# Patient Record
Sex: Female | Born: 1965 | Race: White | Hispanic: No | Marital: Married
Health system: Midwestern US, Community
[De-identification: ages and names within clinical notes are randomized; demographics above are authoritative.]

## PROBLEM LIST (undated history)

## (undated) DIAGNOSIS — C801 Malignant (primary) neoplasm, unspecified: Secondary | ICD-10-CM

## (undated) DIAGNOSIS — E213 Hyperparathyroidism, unspecified: Secondary | ICD-10-CM

## (undated) DIAGNOSIS — R51 Headache: Secondary | ICD-10-CM

## (undated) DIAGNOSIS — I10 Essential (primary) hypertension: Secondary | ICD-10-CM

## (undated) DIAGNOSIS — R519 Headache, unspecified: Secondary | ICD-10-CM

## (undated) DIAGNOSIS — E042 Nontoxic multinodular goiter: Secondary | ICD-10-CM

## (undated) DIAGNOSIS — T4145XA Adverse effect of unspecified anesthetic, initial encounter: Secondary | ICD-10-CM

## (undated) DIAGNOSIS — E0590 Thyrotoxicosis, unspecified without thyrotoxic crisis or storm: Secondary | ICD-10-CM

## (undated) HISTORY — DX: Hyperparathyroidism, unspecified: E21.3

## (undated) HISTORY — PX: ENDOMETRIAL ABLATION: SHX621

---

## 1985-11-21 DIAGNOSIS — T8859XA Other complications of anesthesia, initial encounter: Secondary | ICD-10-CM

## 1985-11-21 HISTORY — DX: Other complications of anesthesia, initial encounter: T88.59XA

## 1985-11-21 HISTORY — PX: CERVIX SURGERY: SHX593

## 1995-11-22 HISTORY — PX: TUBAL LIGATION: SHX77

## 1999-07-08 ENCOUNTER — Ambulatory Visit (HOSPITAL_COMMUNITY): Admission: RE | Admit: 1999-07-08 | Discharge: 1999-07-08 | Payer: Self-pay | Admitting: Family Medicine

## 1999-07-08 ENCOUNTER — Encounter: Payer: Self-pay | Admitting: Family Medicine

## 2000-12-14 ENCOUNTER — Ambulatory Visit (HOSPITAL_COMMUNITY): Admission: RE | Admit: 2000-12-14 | Discharge: 2000-12-14 | Payer: Self-pay | Admitting: *Deleted

## 2000-12-29 ENCOUNTER — Ambulatory Visit (HOSPITAL_COMMUNITY): Admission: RE | Admit: 2000-12-29 | Discharge: 2000-12-29 | Payer: Self-pay | Admitting: *Deleted

## 2001-10-02 ENCOUNTER — Ambulatory Visit (HOSPITAL_COMMUNITY): Admission: RE | Admit: 2001-10-02 | Discharge: 2001-10-02 | Payer: Self-pay | Admitting: Obstetrics and Gynecology

## 2001-10-02 ENCOUNTER — Encounter (INDEPENDENT_AMBULATORY_CARE_PROVIDER_SITE_OTHER): Payer: Self-pay

## 2002-10-03 ENCOUNTER — Other Ambulatory Visit: Admission: RE | Admit: 2002-10-03 | Discharge: 2002-10-03 | Payer: Self-pay | Admitting: Obstetrics and Gynecology

## 2003-01-13 ENCOUNTER — Encounter: Payer: Self-pay | Admitting: Family Medicine

## 2003-01-13 ENCOUNTER — Encounter: Admission: RE | Admit: 2003-01-13 | Discharge: 2003-01-13 | Payer: Self-pay | Admitting: Family Medicine

## 2003-10-15 ENCOUNTER — Other Ambulatory Visit: Admission: RE | Admit: 2003-10-15 | Discharge: 2003-10-15 | Payer: Self-pay | Admitting: Obstetrics and Gynecology

## 2003-11-25 ENCOUNTER — Encounter: Admission: RE | Admit: 2003-11-25 | Discharge: 2003-11-25 | Payer: Self-pay | Admitting: Family Medicine

## 2004-10-18 ENCOUNTER — Other Ambulatory Visit: Admission: RE | Admit: 2004-10-18 | Discharge: 2004-10-18 | Payer: Self-pay | Admitting: Obstetrics and Gynecology

## 2005-07-04 ENCOUNTER — Other Ambulatory Visit: Admission: RE | Admit: 2005-07-04 | Discharge: 2005-07-04 | Payer: Self-pay | Admitting: Obstetrics and Gynecology

## 2005-07-14 ENCOUNTER — Ambulatory Visit (HOSPITAL_BASED_OUTPATIENT_CLINIC_OR_DEPARTMENT_OTHER): Admission: RE | Admit: 2005-07-14 | Discharge: 2005-07-14 | Payer: Self-pay | Admitting: Obstetrics and Gynecology

## 2005-07-14 ENCOUNTER — Ambulatory Visit (HOSPITAL_COMMUNITY): Admission: RE | Admit: 2005-07-14 | Discharge: 2005-07-14 | Payer: Self-pay | Admitting: Obstetrics and Gynecology

## 2006-10-18 ENCOUNTER — Encounter: Admission: RE | Admit: 2006-10-18 | Discharge: 2006-10-18 | Payer: Self-pay | Admitting: Obstetrics and Gynecology

## 2007-04-24 ENCOUNTER — Encounter: Admission: RE | Admit: 2007-04-24 | Discharge: 2007-04-24 | Payer: Self-pay | Admitting: Obstetrics and Gynecology

## 2009-11-21 HISTORY — PX: VAGINAL HYSTERECTOMY: SUR661

## 2010-07-21 ENCOUNTER — Encounter (INDEPENDENT_AMBULATORY_CARE_PROVIDER_SITE_OTHER): Payer: Self-pay | Admitting: Obstetrics and Gynecology

## 2010-07-21 ENCOUNTER — Inpatient Hospital Stay (HOSPITAL_COMMUNITY)
Admission: RE | Admit: 2010-07-21 | Discharge: 2010-07-22 | Payer: Self-pay | Source: Home / Self Care | Admitting: Obstetrics and Gynecology

## 2010-11-21 LAB — HM DEXA SCAN: HM Dexa Scan: NORMAL

## 2010-12-11 ENCOUNTER — Encounter: Payer: Self-pay | Admitting: Obstetrics and Gynecology

## 2011-02-03 LAB — BASIC METABOLIC PANEL
Calcium: 11 mg/dL — ABNORMAL HIGH (ref 8.4–10.5)
Chloride: 105 mEq/L (ref 96–112)
GFR calc Af Amer: >60 mL/min (ref >60)
Glucose, Bld: 88 mg/dL (ref 70–99)
Sodium: 138 mEq/L (ref 135–145)

## 2011-02-03 LAB — SURGICAL PCR SCREEN: Staphylococcus aureus: INVALID — AB

## 2011-02-03 LAB — MRSA CULTURE

## 2011-02-03 LAB — CBC
HCT: 37.4 % (ref 36.0–46.0)
HCT: 43 % (ref 36.0–46.0)
MCH: 32.7 pg (ref 26.0–34.0)
MCHC: 34.1 g/dL (ref 30.0–36.0)
MCHC: 34.4 g/dL (ref 30.0–36.0)
MCV: 95 fL (ref 78.0–100.0)
RBC: 3.87 MIL/uL (ref 3.87–5.11)
RDW: 13.3 % (ref 11.5–15.5)
RDW: 12.8 % (ref 11.5–15.5)

## 2011-04-08 NOTE — Op Note (Signed)
[facility]  Patient:    Sherry Costa, Sherry Costa Visit Number: [id number] MRN: [id number]          Service Type: Attending:  Janeece Riggers. Dareen Piano, M.D. Dictated by:   Janeece Riggers Dareen Piano, M.D. Proc. Date: 10/02/01                             Operative Report  PREOPERATIVE DIAGNOSIS:       Menorrhagia.  POSTOPERATIVE DIAGNOSIS:      Menorrhagia.  PROCEDURES:                   1. Dilation and curettage.                               2. Cryoablation of endometrium.  SURGEON:                      Mark E. Dareen Piano, M.D.  ANESTHESIA:                   General.  ANTIBIOTICS:                  Ancef 1 g.  DRAINS:                       Red rubber catheter to the bladder.  SPECIMENS:                    Endometrial and endocervical curettings sent to pathology.  COMPLICATIONS:                None.  ESTIMATED BLOOD LOSS:         Minimal.  DESCRIPTION OF PROCEDURE:     The patient was taken to the operating room, where she was placed in the dorsal supine position.  A general anesthetic was administered without complications.  She was then prepped with Hibiclens and her bladder was drained.  She was draped in the usual fashion for D&C.  A sterile speculum was placed in the vagina.  A single-tooth tenaculum was applied to the anterior cervical lip.  The uterus was then sounded to 9 cm. At this point, the endocervical canal was dilated to #27 [place].  Endocervical curettings were then obtained, followed by an endometrial curettage. Specimens were sent to pathology.  At the conclusion of this procedure, the cryoablation machine was prepared.  After the warm up, the cry tip was placed in the right cornu and freeze was performed for six minutes.  Once the thaw was completed, the probe was then placed on the opposite side and, again, freeze performed for six minutes.  This concluded the procedure.  The patient was then taken to the recovery room in stable condition.  She  will be discharged to home with Darvocet to take p.r.n.  She was instructed to follow up in the office in four weeks.  She will be given Toradol in the recovery room. Dictated by:   Janeece Riggers Dareen Piano, M.D. Attending:  Janeece Riggers Dareen Piano, M.D. DD:  10/02/01 TD:  10/02/01 Job: [id number]

## 2011-04-08 NOTE — Op Note (Signed)
NAME:  Sherry Costa, Sherry Costa NO.:  [id number]   MEDICAL RECORD NO.:  [id number]          PATIENT TYPE:  AMB   LOCATION:  [facility]                         FACILITY:  [facility]   PHYSICIAN:  Malva Limes, M.D.    DATE OF BIRTH:  [birth year]   DATE OF PROCEDURE:  07/14/2005  DATE OF DISCHARGE:                                 OPERATIVE REPORT   PREOPERATIVE DIAGNOSES:  1.  Menorrhagia.  2.  Failed cryoablation.   POSTOPERATIVE DIAGNOSES:  1.  Menorrhagia.  2.  Failed cryoablation.   PROCEDURE:  1.  Hysteroscopy.  2.  Endometrial roller ball ablation.   SURGEON:  Malva Limes, M.D.   ANESTHESIA:  General.   ANTIBIOTICS:  Ancef 1 gram.   CONSULTATIONS:  None.   SPECIMENS:  None.   DRAINS:  Red rubber catheter in the bladder.   ESTIMATED BLOOD LOSS:  Minimal.   DESCRIPTION OF PROCEDURE:  The patient was taken to the operating room where  she was placed in the dorsal supine position. A general anesthetic was  administered without complications. She was then placed in the dorsal  lithotomy position, she was prepped with Betadine and her bladder drained  with a red rubber catheter. She was then draped in the usual fashion. A  speculum was placed in the vagina, a single tooth tenaculum was applied to  the anterior cervical lip. The cervix was dilated to a 70 [place]. The uterus  was sounded to 8 cm. The hysteroscope was advanced into the cervical canal.  On entering the uterine cavity, both ostia were visualized. There was no  evidence of any polyps or fibroids. At this point, the roller ball was set  up and the endometrium treated with coagulation beginning posteriorly and  working in a counter clockwise fashion. All areas of endometrium and  endocervix were  then treated. Following this, the loop electrode was used to shave off more  tissue and followup roller ball done. The patient tolerated the procedure  well. She was taken to the recovery room in stable condition.  Instrument and  lap counts were correct x1. She was discharged to home. She will be  instructed to followup in the office in four weeks.           ______________________________  Malva Limes, M.D.     MA/MEDQ  D:  07/14/2005  T:  07/14/2005  Job:  [id number]

## 2012-06-21 LAB — HM PAP SMEAR: HM Pap smear: NORMAL

## 2012-08-06 ENCOUNTER — Ambulatory Visit (INDEPENDENT_AMBULATORY_CARE_PROVIDER_SITE_OTHER): Payer: BC Managed Care – PPO | Admitting: Family Medicine

## 2012-08-06 VITALS — BP 160/90 | HR 99 | Temp 98.0°F | Resp 16 | Ht 63.5 in | Wt 220.0 lb

## 2012-08-06 DIAGNOSIS — R51 Headache: Secondary | ICD-10-CM

## 2012-08-06 DIAGNOSIS — R6889 Other general symptoms and signs: Secondary | ICD-10-CM

## 2012-08-06 DIAGNOSIS — R5383 Other fatigue: Secondary | ICD-10-CM

## 2012-08-06 DIAGNOSIS — R0989 Other specified symptoms and signs involving the circulatory and respiratory systems: Secondary | ICD-10-CM

## 2012-08-06 DIAGNOSIS — R110 Nausea: Secondary | ICD-10-CM

## 2012-08-06 DIAGNOSIS — H538 Other visual disturbances: Secondary | ICD-10-CM

## 2012-08-06 DIAGNOSIS — I10 Essential (primary) hypertension: Secondary | ICD-10-CM

## 2012-08-06 DIAGNOSIS — Z8639 Personal history of other endocrine, nutritional and metabolic disease: Secondary | ICD-10-CM

## 2012-08-06 DIAGNOSIS — R5381 Other malaise: Secondary | ICD-10-CM

## 2012-08-06 LAB — POCT CBC
Hemoglobin: 14.8 g/dL (ref 12.2–16.2)
MCH, POC: 30.2 pg (ref 27–31.2)
MID (cbc): 0.3 (ref 0–0.9)
MPV: 9.1 fL (ref 0–99.8)
POC Granulocyte: 4.4 (ref 2–6.9)
POC LYMPH PERCENT: 19.7 %L (ref 10–50)
POC MID %: 4.9 %M (ref 0–12)
Platelet Count, POC: 314 10*3/uL (ref 142–424)
RDW, POC: 13.4 %
WBC: 5.9 10*3/uL (ref 4.6–10.2)

## 2012-08-06 LAB — GLUCOSE, POCT (MANUAL RESULT ENTRY): POC Glucose: 93 mg/dl (ref 70–99)

## 2012-08-06 MED ORDER — CLONIDINE HCL 0.1 MG PO TABS
0.1000 mg | ORAL_TABLET | Freq: Once | ORAL | Status: AC
  Administered 2012-08-06: 0.1 mg via ORAL

## 2012-08-06 MED ORDER — LISINOPRIL 20 MG PO TABS: 20.0000 mg | ORAL_TABLET | Freq: Every day | ORAL | Status: DC

## 2012-08-06 MED ORDER — ONDANSETRON 4 MG PO TBDP: 4.0000 mg | ORAL_TABLET | Freq: Three times a day (TID) | ORAL | Status: DC | PRN

## 2012-08-06 NOTE — Patient Instructions (Signed)
Start lisinopril as discussed.  zofran as needed up to every 8 hours for nausea. If any recurrence of blurry vision or choking sensation, worsening of headaches, or other worsening symptoms - go to the emergency room or call 911. Recheck in the next 2 -3 days for your blood pressure. Return to the clinic or go to the nearest emergency room if any of your symptoms worsen or new symptoms occur. Your should receive a call or letter about your lab results within the next week to 10 days.

## 2012-08-06 NOTE — Progress Notes (Signed)
Subjective:    Patient ID: Sherry Costa, female    DOB: Mar 09, 1966, 46 y.o.   MRN: [id number]  HPI TEQUITA FALKENHAGEN is a 46 y.o. female  C/o headache - starting this morning.  Woke up due to headache.  Works at night - until 12:30.  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  Hx of headache in past - feels different today - feels like going into neck. Nausea, but no vomiting. Blurry vision - both eyes.  Noticed when driving or reading today.  No chest pain.  Feels like about to choke.  hx of goiter.  Feels like going to choke - trouble swallowing - especially when lying down.  Notices this today only.    Hx of HTN - Off BP meds for months - wanted to find new primary care physician.  No chest pain, no shortness of breath.  Prior on Lisinopril 20mg  QD.   Hx of goiter, with radiation to thyroid - underactive thyroid.  Endocrinologist in [place] - last seen in beginning of year.  Was not on meds.   Stressors recently - separated, and not getting along with college-aged daughter.   Hx of migraines, but has not had one in awhile - does not remember if had nausea or not.   Hx of allergies and sinus pressure with headache.    Review of Systems  Constitutional: Negative for fever and chills.  Respiratory: Positive for choking.   Cardiovascular: Negative for chest pain.  Neurological: Positive for headaches. Negative for dizziness, syncope, speech difficulty, weakness and light-headedness.  Psychiatric/Behavioral: Negative for suicidal ideas and dysphoric mood. The patient is not nervous/anxious.    As above -     Objective:   Physical Exam  Constitutional: She is oriented to person, place, and time. She appears well-developed and well-nourished.       Appears uncomfortable, flat affect - but no distress.   HENT:  Head: Normocephalic and atraumatic.  Eyes: EOM are normal. Pupils are equal, round, and reactive to light.  Cardiovascular: Normal rate, regular rhythm, normal heart sounds and intact  distal pulses.  Exam reveals no gallop and no friction rub.   No murmur heard. Pulmonary/Chest: Effort normal and breath sounds normal.  Abdominal: Soft.  Neurological: She is alert and oriented to person, place, and time. She has normal strength. She displays no tremor. No sensory deficit. She displays a negative Romberg sign. Coordination and gait normal.       No pronator drift. No focal face or extremity weakness, normal speech.   Skin: Skin is warm and dry. She is not diaphoretic.  Psychiatric: She has a normal mood and affect. Her behavior is normal. She expresses no suicidal ideation. She expresses no suicidal plans.   Clonidine 0.1mg  given po at 1640.  1730: feeling better.  Vision is better.  Headache is a little less.  choking sensation better.  Results for orders placed in visit on 08/06/12  GLUCOSE, POCT (MANUAL RESULT ENTRY)      Component Value Range   POC Glucose 93  70 - 99 mg/dl  POCT CBC      Component Value Range   WBC 5.9  4.6 - 10.2 K/uL   Lymph, poc 1.2  0.6 - 3.4   POC LYMPH PERCENT 19.7  10 - 50 %L   MID (cbc) 0.3  0 - 0.9   POC MID % 4.9  0 - 12 %M   POC Granulocyte 4.4  2 - 6.9  Granulocyte percent 75.4  37 - 80 %G   RBC 4.90  4.04 - 5.48 M/uL   Hemoglobin 14.8  12.2 - 16.2 g/dL   HCT, POC 13.0  86.5 - 47.9 %   MCV 96.6  80 - 97 fL   MCH, POC 30.2  27 - 31.2 pg   MCHC 31.3 (*) 31.8 - 35.4 g/dL   RDW, POC 78.4     Platelet Count, POC 314  142 - 424 K/uL   MPV 9.1  0 - 99.8 fL           Assessment & Plan:  KERSTIN CRUSOE is a 46 y.o. female 1. HTN (hypertension)  Basic metabolic panel, lisinopril (PRINIVIL,ZESTRIL) 20 MG tablet  2. Headache  cloNIDine (CATAPRES) tablet 0.1 mg, POCT glucose (manual entry), Basic metabolic panel, TSH, POCT CBC  3. Choking sensation  TSH  4. Fatigue  POCT glucose (manual entry), Basic metabolic panel, TSH, POCT CBC  5. Blurry vision  POCT glucose (manual entry), POCT CBC  6. Nausea  ondansetron (ZOFRAN ODT) 4 MG  disintegrating tablet  7. History of thyroid disease  Ambulatory referral to Endocrinology     Headache - likely multifactorial, with underlying HTN - uncontrolled off meds, social stressors, migraines by hx, and thyroid disease.  Improved symptoms of vision, headache and resolution of choking sensation after clonidine. Will restart lisinopril at prior dose, and recheck in 2-3 days. Rtc/er precautions given. understanding expressed of plan.  Tylenol and zofran for headache and nausea tonight as stronger meds may confuse possible signs of worsening headache or hypertensive urgency.   Hx of thyroid disease - requests new endocrinologist - referral placed.   Patient Instructions  Start lisinopril as discussed.  zofran as needed up to every 8 hours for nausea. If any recurrence of blurry vision or choking sensation, worsening of headaches, or other worsening symptoms - go to the emergency room or call 911. Recheck in the next 2 -3 days for your blood pressure. Return to the clinic or go to the nearest emergency room if any of your symptoms worsen or new symptoms occur. Your should receive a call or letter about your lab results within the next week to 10 days.

## 2012-08-07 LAB — BASIC METABOLIC PANEL
BUN: 12 mg/dL (ref 6–23)
Calcium: 10.6 mg/dL — ABNORMAL HIGH (ref 8.4–10.5)
Chloride: 101 mEq/L (ref 96–112)
Glucose, Bld: 108 mg/dL — ABNORMAL HIGH (ref 70–99)
Sodium: 139 mEq/L (ref 135–145)

## 2012-08-21 DIAGNOSIS — E213 Hyperparathyroidism, unspecified: Secondary | ICD-10-CM

## 2012-08-21 HISTORY — DX: Hyperparathyroidism, unspecified: E21.3

## 2012-09-05 ENCOUNTER — Other Ambulatory Visit: Payer: Self-pay | Admitting: Endocrinology

## 2012-09-05 DIAGNOSIS — E049 Nontoxic goiter, unspecified: Secondary | ICD-10-CM

## 2012-09-12 ENCOUNTER — Ambulatory Visit
Admission: RE | Admit: 2012-09-12 | Discharge: 2012-09-12 | Disposition: A | Discharge status: Home / Self Care | Payer: BC Managed Care – PPO | Source: Ambulatory Visit | Attending: Endocrinology | Admitting: Endocrinology

## 2012-09-12 DIAGNOSIS — E049 Nontoxic goiter, unspecified: Secondary | ICD-10-CM

## 2012-09-13 ENCOUNTER — Other Ambulatory Visit (HOSPITAL_COMMUNITY): Payer: Self-pay | Admitting: Endocrinology

## 2012-09-13 DIAGNOSIS — E213 Hyperparathyroidism, unspecified: Secondary | ICD-10-CM

## 2012-09-24 ENCOUNTER — Encounter (HOSPITAL_COMMUNITY)
Admission: RE | Admit: 2012-09-24 | Discharge: 2012-09-24 | Disposition: A | Discharge status: Home / Self Care | Payer: BC Managed Care – PPO | Source: Ambulatory Visit | Attending: Endocrinology | Admitting: Endocrinology

## 2012-09-24 ENCOUNTER — Ambulatory Visit (HOSPITAL_COMMUNITY)
Admission: RE | Admit: 2012-09-24 | Discharge: 2012-09-24 | Disposition: A | Discharge status: Home / Self Care | Payer: BC Managed Care – PPO | Source: Ambulatory Visit | Attending: Endocrinology | Admitting: Endocrinology

## 2012-09-24 DIAGNOSIS — E213 Hyperparathyroidism, unspecified: Secondary | ICD-10-CM | POA: Insufficient documentation

## 2012-09-24 MED ORDER — TECHNETIUM TC 99M SESTAMIBI - CARDIOLITE
24.1000 | Freq: Once | INTRAVENOUS | Status: AC | PRN
  Administered 2012-09-24: 24.1 via INTRAVENOUS

## 2012-09-26 ENCOUNTER — Other Ambulatory Visit: Payer: Self-pay | Admitting: Family Medicine

## 2012-10-16 ENCOUNTER — Other Ambulatory Visit (HOSPITAL_COMMUNITY): Payer: Self-pay | Admitting: Otolaryngology

## 2012-10-16 DIAGNOSIS — E213 Hyperparathyroidism, unspecified: Secondary | ICD-10-CM

## 2012-10-22 ENCOUNTER — Other Ambulatory Visit: Payer: Self-pay | Admitting: Otolaryngology

## 2012-10-22 NOTE — H&P (Signed)
Assessment   Hypercalcemia (275.42) (E83.52).  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  Snoring (786.09) (R06.83).  Obesity (278.00) (E66.9). Orders  Sleep Study; Sleep Study (PSG); Yes; Yes; Requested for: 10 Sep 2012. Discussed  History of hyperthyroidism, treated with radioactive iodine about 10 years ago. She seems to be doing well from this perspective and her most recent TSH was within normal limits.   Hypercalcemia, most recent test revealed 10.6. We will see if anything shows up on the ultrasound scheduled later this week and consider sestamibi scan following that. If she needs thyroid surgery, that is an excellent time to also perform her thyroidectomy.   Thyroid nodule. Dominant nodule palpable in the isthmus. She has ultrasound scheduled for Wednesday. I will try to obtain the most recent one from last year from [facility].   Some of her choking and neck tightness sensation may be reflux-related. Recommend she cut back on caffeine and peppermint.   Snoring, possible sleep apnea. Recommend a sleep study. Reason For Visit  Sherry Costa is here today at the kind request of Balan, Bindubal for consultation and opinion. Goiter on right side. HPI  She was diagnosed with hyperthyroidism about 10 years ago and treated with radioactive iodine ablation. She has been followed by several different physicians for multinodular goiter. She has an ultrasound scheduled for Wednesday. The most recent one prior to that was [place] [facility] a year ago. I do not have the results of that. She does complain of a tightness in her neck and a choking sensation especially if she turns her head to the left. She denies any active reflux symptoms although she has had that in the past. She drinks it can or bottle of Pepsi each day but she tends to like mint flavored candy. She also has a severe problem with snoring and wakes herself up snoring sometimes. She doesn't  get a very good sleep quality and gets fatigued and very tired during the day. She does work irregular hours and sometimes works at night. She also suffers with migraine. Allergies  No Known Drug Allergies. Current Meds  Lisinopril 20 MG Oral Tablet;TAKE 1 TABLET DAILY.; RPT. Active Problems  Essential hypertension  (401.9) (I10) Goiter diffuse, nontoxic  (240.9) (E04.0) Hypercalcemia  (275.42) (E83.52) Nonspecific abnormal results of thyroid function study  (794.5) (R94.6) Nontoxic multinodular goiter  (241.1) (E04.2) Thyroid disorder  (246.9) (E07.9) Vitamin d deficiency  (268.9) (E55.9). PMH  History of diabetes mellitus (V12.29) (Z86.39) History of hyperlipidemia (V12.29) (Z86.39). Family Hx  Family history of deafness or hearing loss: Mother,Father (V19.2) (Z82.2). Personal Hx  Current non-smoker (V49.89) (Z78.9) Never a smoker Occupation:; [employer], Clinical biochemist. ROS  Systemic: Not feeling tired (fatigue).  No fever, no night sweats, and no recent weight loss. Head: No headache. Eyes: No eye symptoms. Otolaryngeal: No hearing loss, no earache, no tinnitus, and no purulent nasal discharge.  No nasal passage blockage (stuffiness), no snoring, no sneezing, no hoarseness, and no sore throat. Cardiovascular: No chest pain or discomfort  and no palpitations. Pulmonary: No dyspnea, no cough, and no wheezing. Gastrointestinal: No dysphagia  and no heartburn.  No nausea, no abdominal pain, and no melena.  No diarrhea. Genitourinary: No dysuria. Endocrine: No muscle weakness. Musculoskeletal: No calf muscle cramps, no arthralgias, and no soft tissue swelling. Neurological: No dizziness, no fainting, no tingling, and no numbness. Psychological: No anxiety  and no depression. Skin: No rash. 12 system ROS was obtained and reviewed on  the Health Maintenance form dated today.  Positive responses are shown above.  If the symptom is not checked, the patient has denied it. Vital Signs     Recorded by Rogers,Lisa on 10 Sep 2012 01:20 PM BP:174/114,  Height: 62.5 in, Weight: 210 lb, BMI: 37.8 kg/m2,  BMI Calculated: 37.80 ,  BSA Calculated: 1.96. Physical Exam  APPEARANCE: Well developed, overweight lady, in no acute distress.  Normal affect, in a pleasant mood.  Oriented to time, place and person. COMMUNICATION: Normal voice   HEAD & FACE:  No scars, lesions or masses of head and face.  Sinuses nontender to palpation.  Salivary glands without mass or tenderness.  Facial strength symmetric.  No facial lesion, scars, or mass. EYES: EOMI with normal primary gaze alignment. Visual acuity grossly intact.  PERRLA EXTERNAL EAR & NOSE: No scars, lesions or masses  EAC & TYMPANIC MEMBRANE:  EAC shows no obstructing lesions or debris and tympanic membranes are normal bilaterally with good movement to insufflation. GROSS HEARING: Normal  TMJ:  Nontender  INTRANASAL EXAM: No polyps or purulence.  NASOPHARYNX: Normal, without lesions. LIPS, TEETH & GUMS: No lip lesions, normal dentition and normal gums. ORAL CAVITY/OROPHARYNX:  Oral mucosa moist without lesion or asymmetry of the palate, tongue, tonsil or posterior pharynx. There is no evidence of tonsillar enlargement. LARYNX (mirror exam):  No lesions of the epiglottis, false cord or TVC's and cords move well to phonation. HYPOPHARYNX (mirror exam): No lesions, asymmetry or pooling of secretions. NECK:  Supple without adenopathy or mass. THYROID: 2 cm mass palpable anteriorly, unable to palpate any  other discrete masses.  NEUROLOGIC:  No gross CN deficits. No nystagmus noted.   LYMPHATIC:  No enlarged nodes palpable. Signature  Electronically signed by : Serena Colonel  M.D.; 09/10/2012 3:32 PM EST.

## 2012-10-31 ENCOUNTER — Encounter (HOSPITAL_COMMUNITY): Payer: Self-pay | Admitting: Pharmacy Technician

## 2012-11-01 ENCOUNTER — Encounter (HOSPITAL_COMMUNITY)
Admission: RE | Admit: 2012-11-01 | Discharge: 2012-11-01 | Disposition: A | Discharge status: Home / Self Care | Payer: BC Managed Care – PPO | Source: Ambulatory Visit | Attending: Anesthesiology | Admitting: Anesthesiology

## 2012-11-01 ENCOUNTER — Encounter (HOSPITAL_COMMUNITY)
Admission: RE | Admit: 2012-11-01 | Discharge: 2012-11-01 | Disposition: A | Discharge status: Home / Self Care | Payer: BC Managed Care – PPO | Source: Ambulatory Visit | Attending: Otolaryngology | Admitting: Otolaryngology

## 2012-11-01 ENCOUNTER — Encounter (HOSPITAL_COMMUNITY): Payer: Self-pay

## 2012-11-01 HISTORY — DX: Nontoxic multinodular goiter: E04.2

## 2012-11-01 HISTORY — DX: Essential (primary) hypertension: I10

## 2012-11-01 HISTORY — DX: Hypercalcemia: E83.52

## 2012-11-01 HISTORY — DX: Thyrotoxicosis, unspecified without thyrotoxic crisis or storm: E05.90

## 2012-11-01 HISTORY — DX: Adverse effect of unspecified anesthetic, initial encounter: T41.45XA

## 2012-11-01 LAB — BASIC METABOLIC PANEL
BUN: 9 mg/dL (ref 6–23)
Chloride: 99 mEq/L (ref 96–112)
Creatinine, Ser: 0.84 mg/dL (ref 0.50–1.10)
GFR calc Af Amer: >90 mL/min (ref >90)
GFR calc non Af Amer: 82 mL/min — ABNORMAL LOW (ref >90)
Potassium: 4 mEq/L (ref 3.5–5.1)

## 2012-11-01 LAB — CBC
MCHC: 35 g/dL (ref 30.0–36.0)
MCV: 90.1 fL (ref 78.0–100.0)
Platelets: 295 10*3/uL (ref 150–400)
RDW: 12.6 % (ref 11.5–15.5)
WBC: 4.9 10*3/uL (ref 4.0–10.5)

## 2012-11-01 LAB — SURGICAL PCR SCREEN: Staphylococcus aureus: NEGATIVE

## 2012-11-01 NOTE — Pre-Procedure Instructions (Signed)
20 Sherry Costa  11/01/2012   Your procedure is scheduled on:  December 20  Report to [facility] at 09:30 AM.  Call this number if you have problems the morning of surgery: [phone]   Remember:   Do not eat or drink:After Midnight.  Take these medicines the morning of surgery with A SIP OF WATER: Eye drops   STOP Vitamin D after December 13  Do not wear jewelry, make-up or nail polish.  Do not wear lotions, powders, or perfumes. You may wear deodorant.  Do not shave 48 hours prior to surgery. Men may shave face and neck.  Do not bring valuables to the hospital.  Contacts, dentures or bridgework may not be worn into surgery.  Leave suitcase in the car. After surgery it may be brought to your room.  For patients admitted to the hospital, checkout time is 11:00 AM the day of discharge.   Special Instructions: Shower using CHG 2 nights before surgery and the night before surgery.  If you shower the day of surgery use CHG.  Use special wash - you have one bottle of CHG for all showers.  You should use approximately 1/3 of the bottle for each shower.   Please read over the following fact sheets that you were given: Pain Booklet, Coughing and Deep Breathing and Surgical Site Infection Prevention

## 2012-11-08 MED ORDER — CEFAZOLIN SODIUM-DEXTROSE 2-3 GM-% IV SOLR
2.0000 g | INTRAVENOUS | Status: AC
  Administered 2012-11-09: 2 g via INTRAVENOUS
  Filled 2012-11-08: qty 50

## 2012-11-09 ENCOUNTER — Ambulatory Visit (HOSPITAL_COMMUNITY): Payer: BC Managed Care – PPO | Admitting: Certified Registered Nurse Anesthetist

## 2012-11-09 ENCOUNTER — Encounter (HOSPITAL_COMMUNITY)
Admission: RE | Disposition: A | Discharge status: Home / Self Care | Payer: Self-pay | Source: Ambulatory Visit | Attending: Otolaryngology

## 2012-11-09 ENCOUNTER — Encounter (HOSPITAL_COMMUNITY): Payer: Self-pay | Admitting: Certified Registered Nurse Anesthetist

## 2012-11-09 ENCOUNTER — Observation Stay (HOSPITAL_COMMUNITY)
Admission: RE | Admit: 2012-11-09 | Discharge: 2012-11-10 | Disposition: A | Discharge status: Home / Self Care | Payer: BC Managed Care – PPO | Source: Ambulatory Visit | Attending: Otolaryngology | Admitting: Otolaryngology

## 2012-11-09 ENCOUNTER — Encounter (HOSPITAL_COMMUNITY)
Admission: RE | Admit: 2012-11-09 | Discharge: 2012-11-09 | Disposition: A | Discharge status: Home / Self Care | Payer: BC Managed Care – PPO | Source: Ambulatory Visit | Attending: Otolaryngology | Admitting: Otolaryngology

## 2012-11-09 DIAGNOSIS — E079 Disorder of thyroid, unspecified: Secondary | ICD-10-CM

## 2012-11-09 DIAGNOSIS — E213 Hyperparathyroidism, unspecified: Secondary | ICD-10-CM | POA: Insufficient documentation

## 2012-11-09 DIAGNOSIS — C73 Malignant neoplasm of thyroid gland: Principal | ICD-10-CM | POA: Insufficient documentation

## 2012-11-09 DIAGNOSIS — E0520 Thyrotoxicosis with toxic multinodular goiter without thyrotoxic crisis or storm: Secondary | ICD-10-CM | POA: Insufficient documentation

## 2012-11-09 DIAGNOSIS — I10 Essential (primary) hypertension: Secondary | ICD-10-CM | POA: Insufficient documentation

## 2012-11-09 HISTORY — PX: TOTAL THYROIDECTOMY: SHX2547

## 2012-11-09 HISTORY — PX: THYROIDECTOMY: SHX17

## 2012-11-09 HISTORY — DX: Headache: R51

## 2012-11-09 HISTORY — DX: Headache, unspecified: R51.9

## 2012-11-09 HISTORY — PX: PARATHYROIDECTOMY: SHX19

## 2012-11-09 SURGERY — THYROIDECTOMY
Anesthesia: General | Site: Neck | Laterality: Right | Wound class: Clean

## 2012-11-09 MED ORDER — SUCCINYLCHOLINE CHLORIDE 20 MG/ML IJ SOLN
INTRAMUSCULAR | Status: DC | PRN
  Administered 2012-11-09: 120 mg via INTRAVENOUS

## 2012-11-09 MED ORDER — PROMETHAZINE HCL 25 MG RE SUPP: 25.0000 mg | Freq: Four times a day (QID) | RECTAL | Status: DC | PRN

## 2012-11-09 MED ORDER — ONDANSETRON HCL 4 MG/2ML IJ SOLN: 4.0000 mg | Freq: Once | INTRAMUSCULAR | Status: DC | PRN

## 2012-11-09 MED ORDER — IBUPROFEN 100 MG/5ML PO SUSP
400.0000 mg | Freq: Four times a day (QID) | ORAL | Status: DC | PRN
  Filled 2012-11-09: qty 20

## 2012-11-09 MED ORDER — PROMETHAZINE HCL 25 MG PO TABS: 25.0000 mg | ORAL_TABLET | Freq: Four times a day (QID) | ORAL | Status: DC | PRN

## 2012-11-09 MED ORDER — GLYCOPYRROLATE 0.2 MG/ML IJ SOLN
INTRAMUSCULAR | Status: DC | PRN
  Administered 2012-11-09: 0.6 mg via INTRAVENOUS

## 2012-11-09 MED ORDER — PHENYLEPHRINE HCL 10 MG/ML IJ SOLN
INTRAMUSCULAR | Status: DC | PRN
  Administered 2012-11-09: 40 ug via INTRAVENOUS
  Administered 2012-11-09 (×2): 80 ug via INTRAVENOUS

## 2012-11-09 MED ORDER — ONDANSETRON HCL 4 MG/2ML IJ SOLN
INTRAMUSCULAR | Status: DC | PRN
  Administered 2012-11-09: 4 mg via INTRAVENOUS

## 2012-11-09 MED ORDER — MIDAZOLAM HCL 5 MG/5ML IJ SOLN
INTRAMUSCULAR | Status: DC | PRN
  Administered 2012-11-09: 2 mg via INTRAVENOUS

## 2012-11-09 MED ORDER — OLOPATADINE HCL 0.1 % OP SOLN
1.0000 [drp] | Freq: Two times a day (BID) | OPHTHALMIC | Status: DC | PRN
  Filled 2012-11-09: qty 5

## 2012-11-09 MED ORDER — ACETAMINOPHEN 10 MG/ML IV SOLN
1000.0000 mg | Freq: Once | INTRAVENOUS | Status: AC | PRN
  Administered 2012-11-09: 1000 mg via INTRAVENOUS

## 2012-11-09 MED ORDER — HYDROCODONE-ACETAMINOPHEN 7.5-325 MG PO TABS: 1.0000 | ORAL_TABLET | Freq: Four times a day (QID) | ORAL | Status: DC | PRN

## 2012-11-09 MED ORDER — BACITRACIN ZINC 500 UNIT/GM EX OINT
TOPICAL_OINTMENT | CUTANEOUS | Status: AC
  Filled 2012-11-09: qty 15

## 2012-11-09 MED ORDER — 0.9 % SODIUM CHLORIDE (POUR BTL) OPTIME
TOPICAL | Status: DC | PRN
  Administered 2012-11-09: 1000 mL

## 2012-11-09 MED ORDER — LACTATED RINGERS IV SOLN
INTRAVENOUS | Status: DC | PRN
  Administered 2012-11-09 (×2): via INTRAVENOUS

## 2012-11-09 MED ORDER — PROPOFOL 10 MG/ML IV BOLUS
INTRAVENOUS | Status: DC | PRN
  Administered 2012-11-09: 170 mg via INTRAVENOUS
  Administered 2012-11-09: 30 mg via INTRAVENOUS

## 2012-11-09 MED ORDER — FENTANYL CITRATE 0.05 MG/ML IJ SOLN
INTRAMUSCULAR | Status: DC | PRN
  Administered 2012-11-09: 100 ug via INTRAVENOUS
  Administered 2012-11-09: 50 ug via INTRAVENOUS
  Administered 2012-11-09: 100 ug via INTRAVENOUS
  Administered 2012-11-09: 50 ug via INTRAVENOUS

## 2012-11-09 MED ORDER — ROCURONIUM BROMIDE 100 MG/10ML IV SOLN
INTRAVENOUS | Status: DC | PRN
  Administered 2012-11-09: 10 mg via INTRAVENOUS
  Administered 2012-11-09: 30 mg via INTRAVENOUS

## 2012-11-09 MED ORDER — LIDOCAINE-EPINEPHRINE 1 %-1:100000 IJ SOLN: INTRAMUSCULAR | Status: DC | PRN

## 2012-11-09 MED ORDER — LISINOPRIL 20 MG PO TABS
20.0000 mg | ORAL_TABLET | Freq: Every day | ORAL | Status: DC
  Administered 2012-11-09 – 2012-11-10 (×2): 20 mg via ORAL
  Filled 2012-11-09 (×2): qty 1

## 2012-11-09 MED ORDER — DEXAMETHASONE SODIUM PHOSPHATE 4 MG/ML IJ SOLN
INTRAMUSCULAR | Status: DC | PRN
  Administered 2012-11-09: 8 mg via INTRAVENOUS

## 2012-11-09 MED ORDER — SODIUM CHLORIDE 0.9 % IV SOLN
10.0000 mg | INTRAVENOUS | Status: DC | PRN
  Administered 2012-11-09: 25 ug/min via INTRAVENOUS

## 2012-11-09 MED ORDER — VITAMIN D3 25 MCG (1000 UNIT) PO TABS
1000.0000 [IU] | ORAL_TABLET | Freq: Every day | ORAL | Status: DC
  Administered 2012-11-09 – 2012-11-10 (×2): 1000 [IU] via ORAL
  Filled 2012-11-09 (×2): qty 1

## 2012-11-09 MED ORDER — ARTIFICIAL TEARS OP OINT
TOPICAL_OINTMENT | OPHTHALMIC | Status: DC | PRN
  Administered 2012-11-09: 1 via OPHTHALMIC

## 2012-11-09 MED ORDER — HYDROMORPHONE HCL PF 1 MG/ML IJ SOLN: 0.2500 mg | INTRAMUSCULAR | Status: DC | PRN

## 2012-11-09 MED ORDER — LIDOCAINE-EPINEPHRINE 1 %-1:100000 IJ SOLN
INTRAMUSCULAR | Status: AC
  Filled 2012-11-09: qty 1

## 2012-11-09 MED ORDER — ACETAMINOPHEN 10 MG/ML IV SOLN
INTRAVENOUS | Status: AC
  Filled 2012-11-09: qty 100

## 2012-11-09 MED ORDER — LIDOCAINE HCL (CARDIAC) 20 MG/ML IV SOLN
INTRAVENOUS | Status: DC | PRN
  Administered 2012-11-09: 40 mg via INTRAVENOUS

## 2012-11-09 MED ORDER — HYDROCODONE-ACETAMINOPHEN 5-325 MG PO TABS
1.0000 | ORAL_TABLET | ORAL | Status: DC | PRN
  Administered 2012-11-09: 1 via ORAL
  Filled 2012-11-09: qty 1

## 2012-11-09 MED ORDER — DEXTROSE-NACL 5-0.9 % IV SOLN
INTRAVENOUS | Status: DC
  Administered 2012-11-09: 15:00:00 via INTRAVENOUS

## 2012-11-09 MED ORDER — NEOSTIGMINE METHYLSULFATE 1 MG/ML IJ SOLN
INTRAMUSCULAR | Status: DC | PRN
  Administered 2012-11-09: 4 mg via INTRAVENOUS

## 2012-11-09 MED ORDER — TECHNETIUM TC 99M SESTAMIBI GENERIC - CARDIOLITE
25.0000 | Freq: Once | INTRAVENOUS | Status: AC | PRN
  Administered 2012-11-09: 25 via INTRAVENOUS

## 2012-11-09 SURGICAL SUPPLY — 48 items
ADH SKN CLS APL DERMABOND .7 (GAUZE/BANDAGES/DRESSINGS) ×1
APPLIER CLIP 9.375 SM OPEN (CLIP)
APR CLP SM 9.3 20 MLT OPN (CLIP)
CANISTER SUCTION 2500CC (MISCELLANEOUS) ×2 IMPLANT
CLEANER TIP ELECTROSURG 2X2 (MISCELLANEOUS) ×2 IMPLANT
CLIP APPLIE 9.375 SM OPEN (CLIP) IMPLANT
CLOTH BEACON ORANGE TIMEOUT ST (SAFETY) ×2 IMPLANT
CONT SPEC 4OZ CLIKSEAL STRL BL (MISCELLANEOUS) ×2 IMPLANT
CORDS BIPOLAR (ELECTRODE) ×2 IMPLANT
COVER PROBE W GEL 5X96 (DRAPES) ×1 IMPLANT
COVER SURGICAL LIGHT HANDLE (MISCELLANEOUS) ×2 IMPLANT
DECANTER SPIKE VIAL GLASS SM (MISCELLANEOUS) ×2 IMPLANT
DERMABOND ADVANCED (GAUZE/BANDAGES/DRESSINGS) ×1
DERMABOND ADVANCED .7 DNX12 (GAUZE/BANDAGES/DRESSINGS) IMPLANT
DRAIN SNY 10 ROU (WOUND CARE) ×1 IMPLANT
ELECT COATED BLADE 2.86 ST (ELECTRODE) ×2 IMPLANT
ELECT REM PT RETURN 9FT ADLT (ELECTROSURGICAL) ×2
ELECTRODE REM PT RTRN 9FT ADLT (ELECTROSURGICAL) ×1 IMPLANT
EVACUATOR SILICONE 100CC (DRAIN) ×2 IMPLANT
GAUZE SPONGE 4X4 16PLY XRAY LF (GAUZE/BANDAGES/DRESSINGS) ×2 IMPLANT
GLOVE BIO SURGEON STRL SZ 6.5 (GLOVE) ×1 IMPLANT
GLOVE BIOGEL PI IND STRL 6 (GLOVE) IMPLANT
GLOVE BIOGEL PI INDICATOR 6 (GLOVE) ×1
GLOVE ECLIPSE 7.0 STRL STRAW (GLOVE) ×1 IMPLANT
GLOVE ECLIPSE 7.5 STRL STRAW (GLOVE) ×3 IMPLANT
GLOVE SURG SS PI 6.5 STRL IVOR (GLOVE) ×1 IMPLANT
GOWN PREVENTION PLUS LG XLONG (DISPOSABLE) ×6 IMPLANT
GOWN STRL REIN XL XLG (GOWN DISPOSABLE) ×1 IMPLANT
KIT BASIN OR (CUSTOM PROCEDURE TRAY) ×2 IMPLANT
KIT ROOM TURNOVER OR (KITS) ×2 IMPLANT
NEEDLE 27GAX1X1/2 (NEEDLE) ×2 IMPLANT
NS IRRIG 1000ML POUR BTL (IV SOLUTION) ×2 IMPLANT
PAD ARMBOARD 7.5X6 YLW CONV (MISCELLANEOUS) ×3 IMPLANT
PENCIL FOOT CONTROL (ELECTRODE) ×2 IMPLANT
SPECIMEN JAR MEDIUM (MISCELLANEOUS) IMPLANT
SPONGE INTESTINAL PEANUT (DISPOSABLE) IMPLANT
STAPLER VISISTAT 35W (STAPLE) ×2 IMPLANT
SUT CHROMIC 3 0 SH 27 (SUTURE) IMPLANT
SUT CHROMIC 4 0 PS 2 18 (SUTURE) ×4 IMPLANT
SUT ETHILON 3 0 PS 1 (SUTURE) ×1 IMPLANT
SUT ETHILON 5 0 P 3 18 (SUTURE) ×1
SUT NYLON ETHILON 5-0 P-3 1X18 (SUTURE) ×1 IMPLANT
SUT SILK 3 0 REEL (SUTURE) IMPLANT
SUT SILK 4 0 REEL (SUTURE) ×2 IMPLANT
TOWEL OR 17X24 6PK STRL BLUE (TOWEL DISPOSABLE) ×2 IMPLANT
TOWEL OR 17X26 10 PK STRL BLUE (TOWEL DISPOSABLE) ×2 IMPLANT
TRAY ENT MC OR (CUSTOM PROCEDURE TRAY) ×2 IMPLANT
WATER STERILE IRR 1000ML POUR (IV SOLUTION) IMPLANT

## 2012-11-09 NOTE — Transfer of Care (Signed)
Immediate Anesthesia Transfer of Care Note  Patient: Sherry Costa  Procedure(s) Performed: Procedure(s) (LRB) with comments: THYROIDECTOMY (Right) - RIGHT THYROID LOBECTOMY  AND PARATHRYOIDECTOMY WITH FROZEN SECTION  Patient Location: PACU  Anesthesia Type:General  Level of Consciousness: awake, alert  and oriented  Airway & Oxygen Therapy: Patient Spontanous Breathing  Post-op Assessment: Report given to PACU RN, Post -op Vital signs reviewed and stable, Patient moving all extremities X 4, Patient able to stick tongue midline and able to speak  Post vital signs: Reviewed and stable  Complications: No apparent anesthesia complications

## 2012-11-09 NOTE — Op Note (Signed)
OPERATIVE REPORT  DATE OF SURGERY: 11/09/2012  PATIENT:  Sherry Costa,  46 y.o. female  PRE-OPERATIVE DIAGNOSIS:  THYROID NODULE AND HYPERPARATHYROID  POST-OPERATIVE DIAGNOSIS:  THYROID NODULE AND HYPERPARATHYROID  PROCEDURE:  Procedure(s): THYROIDECTOMY  SURGEON:  Susy Frizzle, MD  ASSISTANTS: Flo Shanks, MD  ANESTHESIA:   General   EBL:  50 ml  DRAINS: JP in neck   LOCAL MEDICATIONS USED:  None  SPECIMEN:  Right thyroid lobe and right parathyroid gland  COUNTS:  Correct  PROCEDURE DETAILS: The patient was taken to the operating room and placed on the operating table in the supine position. A shoulder roll was placed beneath the shoulder blades and the neck was extended. The neck was prepped and draped in a standard fashion. A low collar transverse incision was outlined marking pen and was incised with an electric cautery. Dissection was continued down through the platysma layer. Subplatysmal flaps were elevated superiorly to the thyroid cartilage and inferiorly to the clavicle. Self-retaining thyroid retractor was used throughout the case.  The midline fascia was divided. In the right heart thyroid nodule was exposed. There is a large firm nodule in the anterior aspect of the right lobe. This was dissected out from surrounding tissue were easily keeping the capsule intact. This was sent for frozen section evaluation. 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 In the meantime, the right parathyroid was explored. Using the neoprobe, there was background signal throughout the neck and the thyroid gland. As the remainder of the right thyroid lobe was reflected anteriorly from the superior pole, an oval-shaped nodule was identified and this had very strong signal, approximately twice the background signal. This was dissected free of surrounding tissue and sent also for frozen section. Prior to sending for pathology, signal was also detected  at twice background levels ex vivo. Background levels did not change in the neck. Frozen section on this was consistent with an enlarged parathyroid gland. The remainder of the thyroid dissection was then accomplished by identifying, ligating and dividing the superior pole vasculature, the middle thyroid vein, and then subsequently the inferior pole vasculature. An inferior parathyroid was not identified separately. The recurrent nerve was identified and was preserved. There was a possible small branch that was having anterior towards the lower part of the larynx but this was sacrificed. The gland was brought forward, Berry's ligament was divided. The gland was dissected off of the trachea. There were a couple small nodules just to the left of midline and these were dissected off separately and sent with the main specimen. The main specimen was sent and labeled right thyroid lobectomy. Hemostasis was completed with silk ties and bipolar cautery as needed. Palpation of the left lobe did not reveal any masses. The wound was irrigated with saline. A small round J-P drain was left in the wound and exited through the left-sided incision and secured in place with nylon suture. The midline fascia was reapproximated with chromic suture. The platysma layer and the subcutaneous layer were reapproximated as well. Dermabond was used on the skin. The drain was hooked to suction. There is a good seal. Patient was then awakened, extubated and transferred to recovery in stable condition.   PATIENT DISPOSITION:  To PACU, stable

## 2012-11-09 NOTE — Anesthesia Postprocedure Evaluation (Signed)
  Anesthesia Post-op Note  Patient: Sherry Costa  Procedure(s) Performed: Procedure(s) (LRB) with comments: THYROIDECTOMY (Right) - RIGHT THYROID LOBECTOMY  AND PARATHRYOIDECTOMY WITH FROZEN SECTION  Patient Location: PACU  Anesthesia Type:General  Level of Consciousness: awake, alert  and oriented  Airway and Oxygen Therapy: Patient Spontanous Breathing and Patient connected to nasal cannula oxygen  Post-op Pain: mild  Post-op Assessment: Post-op Vital signs reviewed, Patient's Cardiovascular Status Stable, Respiratory Function Stable and Patent Airway  Post-op Vital Signs: stable  Complications: No apparent anesthesia complications

## 2012-11-09 NOTE — Interval H&P Note (Signed)
History and Physical Interval Note:  11/09/2012 11:02 AM  Sherry Costa  has presented today for surgery, with the diagnosis of THYROID NODULE AND HYPERPARATHYROID  The various methods of treatment have been discussed with the patient and family. After consideration of risks, benefits and other options for treatment, the patient has consented to  Procedure(s) (LRB) with comments: THYROIDECTOMY (Right) - RIGHT THYROID LOBECTOMY POSSIBLE TOTAL AND PARATHRYOIDECTOMY WITH FROZEN SECTION as a surgical intervention .  The patient's history has been reviewed, patient examined, no change in status, stable for surgery.  I have reviewed the patient's chart and labs.  Questions were answered to the patient's satisfaction.     Letroy Vazguez

## 2012-11-09 NOTE — Progress Notes (Signed)
   ENT Progress Note: s/p Procedure(s): Rt Hemithyroidectomy and parathyroidectomy   Subjective: C/O mild pain  Objective: Vital signs in last 24 hours: Temp:  [98.2 F (36.8 C)-99.4 F (37.4 C)] 98.2 F (36.8 C) (12/20 1817) Pulse Rate:  [81-113] 89  (12/20 1817) Resp:  [10-20] 20  (12/20 1817) BP: (132-175)/(79-112) 147/94 mmHg (12/20 1817) SpO2:  [92 %-98 %] 98 % (12/20 1817) Weight:  [96.163 kg (212 lb)] 96.163 kg (212 lb) (12/20 1721) Weight change:  Last BM Date: 11/09/12  Intake/Output from previous day:   Intake/Output this shift: Total I/O In: 1600 [I.V.:1600] Out: 480 [Urine:450; Blood:30]  Labs: No results found for this basename: WBC:2,HGB:2,HCT:2,PLT:2 in the last 72 hours No results found for this basename: NA:2,K:2,CL:2,CO2:2,GLUCOSE:2,BUN:2,CREATININR:2,CALCIUM:2 in the last 72 hours  Studies/Results: Nm Parathyroid Inj - No Rpt  11/09/2012  CLINICAL DATA: MERP INJECTION PRIOR TO OR @ 12:00   Sestamibi injected by the nuclear medicine technologist       PHYSICAL EXAM: Inc intact Min JP output NO swelling   Assessment/Plan: Stable postop Monitor JP output    Sherry Costa 11/09/2012, 6:25 PM

## 2012-11-09 NOTE — Preoperative (Signed)
Beta Blockers   Reason not to administer Beta Blockers:Not Applicable, No BB  

## 2012-11-09 NOTE — Anesthesia Preprocedure Evaluation (Signed)
Anesthesia Evaluation  Patient identified by MRN, date of birth, ID band  Reviewed: Allergy & Precautions, H&P , NPO status , Patient's Chart, lab work & pertinent test results  History of Anesthesia Complications (+) AWARENESS UNDER ANESTHESIA  Airway Mallampati: II TM Distance: >3 FB Neck ROM: Full    Dental  (+) Teeth Intact and Dental Advisory Given   Pulmonary neg pulmonary ROS,  breath sounds clear to auscultation  Pulmonary exam normal       Cardiovascular hypertension, Pt. on medications Rhythm:Regular Rate:Normal     Neuro/Psych  Headaches, negative psych ROS   GI/Hepatic negative GI ROS, Neg liver ROS,   Endo/Other  Hyperthyroidism   Renal/GU negative Renal ROS  negative genitourinary   Musculoskeletal negative musculoskeletal ROS (+)   Abdominal Normal abdominal exam  (+) + obese,   Peds  Hematology negative hematology ROS (+)   Anesthesia Other Findings   Reproductive/Obstetrics negative OB ROS                           Anesthesia Physical Anesthesia Plan  ASA: II  Anesthesia Plan: General   Post-op Pain Management:    Induction: Intravenous  Airway Management Planned: Oral ETT  Additional Equipment:   Intra-op Plan:   Post-operative Plan: Extubation in OR  Informed Consent: I have reviewed the patients History and Physical, chart, labs and discussed the procedure including the risks, benefits and alternatives for the proposed anesthesia with the patient or authorized representative who has indicated his/her understanding and acceptance.   Dental advisory given  Plan Discussed with: CRNA, Anesthesiologist and Surgeon  Anesthesia Plan Comments:         Anesthesia Quick Evaluation

## 2012-11-10 NOTE — Progress Notes (Signed)
   ENT Progress Note: POD #1 s/p Procedure Rt Hemithyroidectomy and parathyroidectomy    Subjective: Stable, min Sore throat  Objective: Vital signs in last 24 hours: Temp:  [98.2 F (36.8 C)-99.4 F (37.4 C)] 98.4 F (36.9 C) (12/21 0603) Pulse Rate:  [81-113] 100  (12/21 0603) Resp:  [10-20] 18  (12/21 0603) BP: (132-175)/(67-112) 157/67 mmHg (12/21 0603) SpO2:  [92 %-100 %] 100 % (12/21 0603) Weight:  [96.163 kg (212 lb)] 96.163 kg (212 lb) (12/20 1721) Weight change:  Last BM Date: 11/09/12  Intake/Output from previous day: 12/20 0701 - 12/21 0700 In: 1600 [I.V.:1600] Out: 500 [Urine:450; Drains:20; Blood:30] Intake/Output this shift:    Labs: No results found for this basename: WBC:2,HGB:2,HCT:2,PLT:2 in the last 72 hours No results found for this basename: NA:2,K:2,CL:2,CO2:2,GLUCOSE:2,BUN:2,CREATININR:2,CALCIUM:2 in the last 72 hours  Studies/Results: Nm Parathyroid Inj - No Rpt  11/09/2012  CLINICAL DATA: MERP INJECTION PRIOR TO OR @ 12:00   Sestamibi injected by the nuclear medicine technologist       PHYSICAL EXAM: Inc intact, no swelling JP removed Nl voice, nl airway  Assessment/Plan: Pt stable O/N D/C to home    Myrtle Haller 11/10/2012, 8:05 AM

## 2012-11-10 NOTE — Progress Notes (Signed)
Home care instructions gone over. Home medications gone over. Prescriptions given. Diet, activity, and incisional care gone over. Follow up appointment to be made. Signs and symptoms of tetany and or worsening condition gone over. Patient verbalized understanding of instructions.

## 2012-11-10 NOTE — Discharge Summary (Signed)
Physician Discharge Summary  Patient ID: Sherry Costa MRN: [id number] DOB/AGE: [birth year] 46 y.o.  Admit date: 11/09/2012 Discharge date: 11/10/2012  Admission Diagnoses:  Thyroid mass Hypercalcemia  Discharge Diagnoses:  Same  Surgeries: Procedure(s): Rt Hemithyroidectomy and parathyroidectomy  on 11/09/2012   Consultants: none  Discharged Condition: Improved  Hospital Course: Sherry Costa is an 46 y.o. female who was admitted 11/09/2012 with a diagnosis of Thyroid mass & Hypercalcemia and went to the operating room on 11/09/2012 and underwent the above named Rt Hemithyroidectomy and parathyroidectomy.  Pt stable on POD #1, d/c'ed to home.  Recent vital signs:  Filed Vitals:   11/10/12 0603  BP: 157/67  Pulse: 100  Temp: 98.4 F (36.9 C)  Resp: 18    Recent laboratory studies:  Results for orders placed during the hospital encounter of 11/01/12  SURGICAL PCR SCREEN      Component Value Range   MRSA, PCR NEGATIVE  NEGATIVE   Staphylococcus aureus NEGATIVE  NEGATIVE  CBC      Component Value Range   WBC 4.9  4.0 - 10.5 K/uL   RBC 4.63  3.87 - 5.11 MIL/uL   Hemoglobin 14.6  12.0 - 15.0 g/dL   HCT 95.2  84.1 - 32.4 %   MCV 90.1  78.0 - 100.0 fL   MCH 31.5  26.0 - 34.0 pg   MCHC 35.0  30.0 - 36.0 g/dL   RDW 40.1  02.7 - 25.3 %   Platelets 295  150 - 400 K/uL  BASIC METABOLIC PANEL      Component Value Range   Sodium 138  135 - 145 mEq/L   Potassium 4.0  3.5 - 5.1 mEq/L   Chloride 99  96 - 112 mEq/L   CO2 29  19 - 32 mEq/L   Glucose, Bld 95  70 - 99 mg/dL   BUN 9  6 - 23 mg/dL   Creatinine, Ser 6.64  0.50 - 1.10 mg/dL   Calcium 40.3 (*) 8.4 - 10.5 mg/dL   GFR calc non Af Amer 82 (*) >90 mL/min   GFR calc Af Amer >90  >90 mL/min    Discharge Medications:     Medication List     As of 11/10/2012  8:09 AM    TAKE these medications         cholecalciferol 1000 UNITS tablet   Commonly known as: VITAMIN D   Take 1,000 Units by mouth daily.       HYDROcodone-acetaminophen 7.5-325 MG per tablet   Commonly known as: NORCO   Take 1 tablet by mouth every 6 (six) hours as needed for pain.      lisinopril 20 MG tablet   Commonly known as: PRINIVIL,ZESTRIL   Take 20 mg by mouth daily.      olopatadine 0.1 % ophthalmic solution   Commonly known as: PATANOL   Place 1 drop into both eyes 2 (two) times daily as needed. For itchy eyes.      promethazine 25 MG suppository   Commonly known as: PHENERGAN   Place 1 suppository (25 mg total) rectally every 6 (six) hours as needed for nausea.        Diagnostic Studies: Dg Chest 2 View  11/01/2012  *RADIOLOGY REPORT*  Clinical Data: Hypertension, preoperative evaluation for thyroid surgery  CHEST - 2 VIEW  Comparison: None.  Findings: The heart and pulmonary vascularity are within normal limits.  The lungs are clear.  No acute bony abnormality is seen.  IMPRESSION: No acute abnormality noted.   Original Report Authenticated By: Alcide Clever, M.D.    Nm Parathyroid Inj - No Rpt  11/09/2012  CLINICAL DATA: MERP INJECTION PRIOR TO OR @ 12:00   Sestamibi injected by the nuclear medicine technologist      Disposition:       Discharge Orders    Future Orders Please Complete By Expires   Diet - low sodium heart healthy      Increase activity slowly      Discharge instructions      Comments:   1. Limited activity 2. Liquid and soft diet 3. May bathe and shower 4. Elevate Head of Bed      Follow-up Information    Follow up with ROSEN, JEFRY, MD. In 1 week.   Contact information:   [address], [address] [place] [address] [phone]           Signed: Annalee Genta, Corrion Stirewalt 11/10/2012, 8:09 AM

## 2012-11-12 ENCOUNTER — Other Ambulatory Visit: Payer: Self-pay | Admitting: Physician Assistant

## 2012-11-12 ENCOUNTER — Encounter (HOSPITAL_COMMUNITY): Payer: Self-pay | Admitting: Otolaryngology

## 2012-11-12 NOTE — Telephone Encounter (Signed)
Needs OV, was supposed to f/u in Sept 2013

## 2012-12-06 ENCOUNTER — Other Ambulatory Visit: Payer: Self-pay | Admitting: Physician Assistant

## 2012-12-06 ENCOUNTER — Telehealth: Payer: Self-pay

## 2013-01-07 ENCOUNTER — Encounter: Payer: Self-pay | Admitting: Family Medicine

## 2013-01-07 ENCOUNTER — Ambulatory Visit (INDEPENDENT_AMBULATORY_CARE_PROVIDER_SITE_OTHER): Payer: BC Managed Care – PPO | Admitting: Family Medicine

## 2013-01-07 VITALS — BP 180/120 | HR 94 | Temp 98.9°F | Resp 16 | Ht 63.0 in | Wt 216.8 lb

## 2013-01-07 DIAGNOSIS — I10 Essential (primary) hypertension: Secondary | ICD-10-CM | POA: Insufficient documentation

## 2013-01-07 DIAGNOSIS — K5900 Constipation, unspecified: Secondary | ICD-10-CM

## 2013-01-07 DIAGNOSIS — C73 Malignant neoplasm of thyroid gland: Secondary | ICD-10-CM | POA: Insufficient documentation

## 2013-01-07 DIAGNOSIS — R102 Pelvic and perineal pain: Secondary | ICD-10-CM

## 2013-01-07 DIAGNOSIS — R109 Unspecified abdominal pain: Secondary | ICD-10-CM

## 2013-01-07 LAB — POCT URINALYSIS DIPSTICK
Bilirubin, UA: NEGATIVE
Ketones, UA: NEGATIVE
Leukocytes, UA: NEGATIVE
Nitrite, UA: NEGATIVE

## 2013-01-07 LAB — BASIC METABOLIC PANEL
CO2: 28 mEq/L (ref 19–32)
Calcium: 9.6 mg/dL (ref 8.4–10.5)
Chloride: 103 mEq/L (ref 96–112)
Sodium: 140 mEq/L (ref 135–145)

## 2013-01-07 LAB — POCT UA - MICROSCOPIC ONLY: WBC, Ur, HPF, POC: NEGATIVE

## 2013-01-07 MED ORDER — LISINOPRIL 20 MG PO TABS: 20.0000 mg | ORAL_TABLET | Freq: Every day | ORAL | Status: DC

## 2013-01-07 NOTE — Progress Notes (Signed)
Subjective:    Patient ID: Sherry Costa, female    DOB: [birth year], 47 y.o.   MRN: [id number]  HPI Sherry Costa is a 47 y.o. female  HTN - prior on lisinopril 20mg  qd., but out of meds for past week. No chest pain, no headaches, lightheadedness or dizziness. No focal weakness. Outside BP's on meds seemed to be ok. "good numbers" on machine, but actual numbers unknown.   Occasional cramp in lower abdomen - 3 times in past month, no swelling, no pain with lifting, no swelling or bulges. No urinary sx's  Thyroid disease - followed by Dr. Pollyann Kennedy. per surgery notes: admitted 11/09/2012 with a diagnosis of Thyroid mass & Hypercalcemia and went to the operating room on 11/09/2012 and underwent Rt Hemithyroidectomy and parathyroidectomy. Pt stable on POD #1, d/c'ed to home. 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 Planning on repeat surgery for complete thyroidectomy - planning on 2nd week of March. Thyroid levels were ok before surgery. No thyroid meds.   Constipation yesterday  - planning on miralax today.   Works at [employer].  Working overtime.  Tired recently.   History   Social History  . Marital Status: Married    Spouse Name: N/A    Number of Children: N/A  . Years of Education: N/A   Occupational History  . Not on file.   Social History Main Topics  . Smoking status: Never Smoker   . Smokeless tobacco: Never Used  . Alcohol Use: No  . Drug Use: No  . Sexually Active: Yes   Other Topics Concern  . Not on file   Social History Narrative  . No narrative on file      Review of Systems  Constitutional: Negative for fever, chills, fatigue and unexpected weight change (some wt gain with stress eating after diagnosis of ca. ).  Respiratory: Negative for chest tightness and shortness of breath.   Cardiovascular: Negative for chest pain, palpitations and leg swelling.  Gastrointestinal: Positive for constipation (at times. last bm yesterday, but hard  stool. ). Negative for abdominal pain and blood in stool.  Endocrine: Negative for cold intolerance and heat intolerance.  Genitourinary: Negative for dysuria, urgency, frequency, hematuria and difficulty urinating.  Skin: Negative for rash.  Neurological: Negative for dizziness, syncope, speech difficulty, weakness, light-headedness and headaches.       Objective:   Physical Exam  Vitals reviewed. Constitutional: She is oriented to person, place, and time. She appears well-developed and well-nourished.  HENT:  Head: Normocephalic and atraumatic.  Eyes: Conjunctivae and EOM are normal. Pupils are equal, round, and reactive to light.  Neck: Carotid bruit is not present.  Cardiovascular: Normal rate, regular rhythm, normal heart sounds and intact distal pulses.   Pulmonary/Chest: Effort normal and breath sounds normal.  Abdominal: Soft. Bowel sounds are normal. She exhibits no distension and no pulsatile midline mass. There is no tenderness. There is no rebound, no guarding and no CVA tenderness. No hernia. Hernia confirmed negative in the ventral area, confirmed negative in the right inguinal area and confirmed negative in the left inguinal area.  Neurological: She is alert and oriented to person, place, and time.  Skin: Skin is warm and dry.  Psychiatric: She has a normal mood and affect. Her behavior is normal.    Results for orders placed in visit on 01/07/13  POCT UA - MICROSCOPIC ONLY      Result Value Range   WBC, Ur, HPF, POC neg  RBC, urine, microscopic 0-1     Bacteria, U Microscopic neg     Mucus, UA neg     Epithelial cells, urine per micros 0-3     Crystals, Ur, HPF, POC neg     Casts, Ur, LPF, POC neg     Yeast, UA neg    POCT URINALYSIS DIPSTICK      Result Value Range   Color, UA yellow     Clarity, UA clear     Glucose, UA neg     Bilirubin, UA neg     Ketones, UA neg     Spec Grav, UA 1.020     Blood, UA neg     pH, UA 7.0     Protein, UA 30mg /dl      Urobilinogen, UA 0.2     Nitrite, UA neg     Leukocytes, UA Negative         Assessment & Plan:  Sherry Costa is a 47 y.o. female Follicular thyroid cancer - Plan: Basic metabolic panel, TSH - with recent constipation, and s/p hemithyroidectomy.  Followed by Dr. Talmage Nap, and Dr. Pollyann Kennedy - can coordinate results with them.   HTN (hypertension) - Plan: Basic metabolic panel. Trace protein on Ua - will check BMP and restart meds.  Recheck with outside blood pressure readings.     Suprapubic pain - Plan: POCT UA - Microscopic Only, POCT urinalysis dipstick - reassuring. Recheck in next 6 weeks - sooner if more recurrent/worsening.   Unspecified constipation - stool softener if needed - check tsh.

## 2013-01-07 NOTE — Patient Instructions (Signed)
Keep a record of your blood pressures outside of the office and bring them to the next office visit. If staying above 140/90 on meds in the next 2 weeks - may need office visit sooner.  Your should receive a call or letter about your lab results within the next week to 10 days.  Keep follow up as planned with your endocrinologist and surgeon.  Recheck in 6 weeks  Return to the clinic or go to the nearest emergency room if any of your symptoms worsen or new symptoms occur, including any increase in the abdominal pain.

## 2013-01-08 ENCOUNTER — Encounter: Payer: Self-pay | Admitting: *Deleted

## 2013-01-17 NOTE — H&P (Signed)
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 Reason For Visit  Follow up from parathyroidectomy. Discussed  She's doing great, she has no complaints. On exam, her voice is normal and strong. There is no neck masses and her incision is nicely healed.   We discussed the final results of the pathology. 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   We discussed the need for completion thyroidectomy on the left side. We discussed the risks of hypocalcemia and recurrent nerve injury. She will call us when she is ready. I will check a calcium level preop. Allergies  No Known Drug Allergies. Current Meds  Lisinopril 20 MG Oral Tablet;TAKE 1 TABLET DAILY.; RPT. 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 Signature  Electronically signed by : Serena Colonel  M.D.; 12/24/2012 2:12 PM EST.

## 2013-01-23 ENCOUNTER — Encounter (HOSPITAL_COMMUNITY)
Admission: RE | Admit: 2013-01-23 | Discharge: 2013-01-23 | Disposition: A | Discharge status: Home / Self Care | Payer: BC Managed Care – PPO | Source: Ambulatory Visit | Attending: Otolaryngology | Admitting: Otolaryngology

## 2013-01-23 ENCOUNTER — Encounter (HOSPITAL_COMMUNITY): Payer: Self-pay

## 2013-01-23 HISTORY — DX: Malignant (primary) neoplasm, unspecified: C80.1

## 2013-01-23 LAB — BASIC METABOLIC PANEL
CO2: 30 mEq/L (ref 19–32)
Calcium: 10.5 mg/dL (ref 8.4–10.5)
Creatinine, Ser: 1.23 mg/dL — ABNORMAL HIGH (ref 0.50–1.10)
GFR calc Af Amer: 60 mL/min — ABNORMAL LOW (ref >90)
GFR calc non Af Amer: 51 mL/min — ABNORMAL LOW (ref >90)
Sodium: 141 mEq/L (ref 135–145)

## 2013-01-23 LAB — CBC
MCH: 32.2 pg (ref 26.0–34.0)
Platelets: 291 10*3/uL (ref 150–400)
RBC: 4.59 MIL/uL (ref 3.87–5.11)
RDW: 12.6 % (ref 11.5–15.5)

## 2013-01-23 LAB — SURGICAL PCR SCREEN: MRSA, PCR: NEGATIVE

## 2013-01-23 NOTE — Pre-Procedure Instructions (Signed)
XITLALIC MASLIN  01/23/2013   Your procedure is scheduled on:  Tuesday, March 11th  Report to [facility] at 0700 AM.  Call this number if you have problems the morning of surgery: [phone]   Remember:   Do not eat food or drink liquids after midnight.    Take these medicines the morning of surgery with A SIP OF WATER: vicodin if needed, eye drops   Do not wear jewelry, make-up or nail polish.  Do not wear lotions, powders, or perfumes,deodorant.  Do not shave 48 hours prior to surgery.   Do not bring valuables to the hospital.  Contacts, dentures or bridgework may not be worn into surgery.  Leave suitcase in the car. After surgery it may be brought to your room.  For patients admitted to the hospital, checkout time is 11:00 AM the day of discharge.   Patients discharged the day of surgery will not be allowed to drive home.  Special Instructions: Shower using CHG 2 nights before surgery and the night before surgery.  If you shower the day of surgery use CHG.  Use special wash - you have one bottle of CHG for all showers.  You should use approximately 1/3 of the bottle for each shower.   Please read over the following fact sheets that you were given: Pain Booklet, Coughing and Deep Breathing, MRSA Information and Surgical Site Infection Prevention

## 2013-01-23 NOTE — Progress Notes (Signed)
Primary Physician - Dr. Karyl Kinnier Does not have a cardiologist  EKG in epic from 2013

## 2013-01-28 MED ORDER — CEFAZOLIN SODIUM-DEXTROSE 2-3 GM-% IV SOLR
2.0000 g | INTRAVENOUS | Status: AC
  Administered 2013-01-29: 2 g via INTRAVENOUS
  Filled 2013-01-28: qty 50

## 2013-01-29 ENCOUNTER — Encounter (HOSPITAL_COMMUNITY)
Admission: RE | Disposition: A | Discharge status: Home / Self Care | Payer: Self-pay | Source: Ambulatory Visit | Attending: Otolaryngology

## 2013-01-29 ENCOUNTER — Ambulatory Visit (HOSPITAL_COMMUNITY): Payer: BC Managed Care – PPO | Admitting: Anesthesiology

## 2013-01-29 ENCOUNTER — Encounter (HOSPITAL_COMMUNITY): Payer: Self-pay | Admitting: Anesthesiology

## 2013-01-29 ENCOUNTER — Observation Stay (HOSPITAL_COMMUNITY)
Admission: RE | Admit: 2013-01-29 | Discharge: 2013-01-30 | Disposition: A | Discharge status: Home / Self Care | Payer: BC Managed Care – PPO | Source: Ambulatory Visit | Attending: Otolaryngology | Admitting: Otolaryngology

## 2013-01-29 DIAGNOSIS — E042 Nontoxic multinodular goiter: Secondary | ICD-10-CM | POA: Insufficient documentation

## 2013-01-29 DIAGNOSIS — Z01812 Encounter for preprocedural laboratory examination: Secondary | ICD-10-CM | POA: Insufficient documentation

## 2013-01-29 DIAGNOSIS — I10 Essential (primary) hypertension: Secondary | ICD-10-CM | POA: Insufficient documentation

## 2013-01-29 DIAGNOSIS — C73 Malignant neoplasm of thyroid gland: Principal | ICD-10-CM | POA: Insufficient documentation

## 2013-01-29 HISTORY — PX: THYROIDECTOMY: SHX17

## 2013-01-29 LAB — CALCIUM
Calcium: 9 mg/dL (ref 8.4–10.5)
Calcium: 9.1 mg/dL (ref 8.4–10.5)

## 2013-01-29 SURGERY — THYROIDECTOMY
Anesthesia: General | Site: Neck | Laterality: Left | Wound class: Clean

## 2013-01-29 MED ORDER — ONDANSETRON HCL 4 MG/2ML IJ SOLN: 4.0000 mg | Freq: Four times a day (QID) | INTRAMUSCULAR | Status: DC | PRN

## 2013-01-29 MED ORDER — ONDANSETRON HCL 4 MG/2ML IJ SOLN
INTRAMUSCULAR | Status: DC | PRN
  Administered 2013-01-29: 4 mg via INTRAVENOUS

## 2013-01-29 MED ORDER — HYDROCODONE-ACETAMINOPHEN 7.5-325 MG PO TABS: 1.0000 | ORAL_TABLET | Freq: Four times a day (QID) | ORAL | Status: AC | PRN

## 2013-01-29 MED ORDER — LEVOTHYROXINE SODIUM 100 MCG PO TABS: 100.0000 ug | ORAL_TABLET | Freq: Every day | ORAL | Status: DC

## 2013-01-29 MED ORDER — HYDROCODONE-ACETAMINOPHEN 5-325 MG PO TABS
1.0000 | ORAL_TABLET | ORAL | Status: DC | PRN
  Administered 2013-01-29 (×2): 2 via ORAL
  Filled 2013-01-29 (×2): qty 2

## 2013-01-29 MED ORDER — VITAMIN D3 25 MCG (1000 UNIT) PO TABS
1000.0000 [IU] | ORAL_TABLET | Freq: Every day | ORAL | Status: DC
  Administered 2013-01-29 – 2013-01-30 (×2): 1000 [IU] via ORAL
  Filled 2013-01-29 (×2): qty 1

## 2013-01-29 MED ORDER — MORPHINE SULFATE 2 MG/ML IJ SOLN: 1.0000 mg | INTRAMUSCULAR | Status: DC | PRN

## 2013-01-29 MED ORDER — LISINOPRIL 20 MG PO TABS
20.0000 mg | ORAL_TABLET | Freq: Every day | ORAL | Status: DC
  Administered 2013-01-29 – 2013-01-30 (×2): 20 mg via ORAL
  Filled 2013-01-29 (×2): qty 1

## 2013-01-29 MED ORDER — NEOSTIGMINE METHYLSULFATE 1 MG/ML IJ SOLN
INTRAMUSCULAR | Status: DC | PRN
  Administered 2013-01-29: 4 mg via INTRAVENOUS

## 2013-01-29 MED ORDER — PROPOFOL 10 MG/ML IV BOLUS
INTRAVENOUS | Status: DC | PRN
  Administered 2013-01-29: 180 mg via INTRAVENOUS

## 2013-01-29 MED ORDER — LEVOTHYROXINE SODIUM 100 MCG PO TABS
100.0000 ug | ORAL_TABLET | Freq: Every day | ORAL | Status: DC
  Administered 2013-01-30: 100 ug via ORAL
  Filled 2013-01-29 (×2): qty 1

## 2013-01-29 MED ORDER — HYDROMORPHONE HCL PF 1 MG/ML IJ SOLN: 0.2500 mg | INTRAMUSCULAR | Status: DC | PRN

## 2013-01-29 MED ORDER — IBUPROFEN 100 MG/5ML PO SUSP
400.0000 mg | Freq: Four times a day (QID) | ORAL | Status: DC | PRN
  Filled 2013-01-29: qty 20

## 2013-01-29 MED ORDER — ALBUTEROL SULFATE HFA 108 (90 BASE) MCG/ACT IN AERS
INHALATION_SPRAY | RESPIRATORY_TRACT | Status: DC | PRN
  Administered 2013-01-29: 2 via RESPIRATORY_TRACT

## 2013-01-29 MED ORDER — MIDAZOLAM HCL 5 MG/5ML IJ SOLN
INTRAMUSCULAR | Status: DC | PRN
  Administered 2013-01-29: 2 mg via INTRAVENOUS

## 2013-01-29 MED ORDER — PROMETHAZINE HCL 25 MG/ML IJ SOLN: 6.2500 mg | INTRAMUSCULAR | Status: DC | PRN

## 2013-01-29 MED ORDER — ACETAMINOPHEN 160 MG/5ML PO SOLN
325.0000 mg | ORAL | Status: DC | PRN
  Administered 2013-01-30: 325 mg via ORAL
  Filled 2013-01-29: qty 20.3

## 2013-01-29 MED ORDER — GLYCOPYRROLATE 0.2 MG/ML IJ SOLN
INTRAMUSCULAR | Status: DC | PRN
  Administered 2013-01-29: 0.6 mg via INTRAVENOUS

## 2013-01-29 MED ORDER — PHENOL 1.4 % MT LIQD
1.0000 | Freq: Three times a day (TID) | OROMUCOSAL | Status: DC | PRN
Start: 2013-01-29 — End: 2013-01-30
  Filled 2013-01-29: qty 177

## 2013-01-29 MED ORDER — BACITRACIN ZINC 500 UNIT/GM EX OINT
TOPICAL_OINTMENT | CUTANEOUS | Status: AC
  Filled 2013-01-29: qty 15

## 2013-01-29 MED ORDER — PROMETHAZINE HCL 25 MG RE SUPP: 25.0000 mg | Freq: Four times a day (QID) | RECTAL | Status: AC | PRN

## 2013-01-29 MED ORDER — LACTATED RINGERS IV SOLN
INTRAVENOUS | Status: DC
  Administered 2013-01-29: 09:00:00 via INTRAVENOUS

## 2013-01-29 MED ORDER — OLOPATADINE HCL 0.1 % OP SOLN: 1.0000 [drp] | Freq: Two times a day (BID) | OPHTHALMIC | Status: DC | PRN

## 2013-01-29 MED ORDER — PHENYLEPHRINE HCL 10 MG/ML IJ SOLN
INTRAMUSCULAR | Status: DC | PRN
  Administered 2013-01-29: 40 ug via INTRAVENOUS
  Administered 2013-01-29: 80 ug via INTRAVENOUS
  Administered 2013-01-29: 40 ug via INTRAVENOUS

## 2013-01-29 MED ORDER — LIDOCAINE-EPINEPHRINE 1 %-1:100000 IJ SOLN
INTRAMUSCULAR | Status: DC | PRN
  Administered 2013-01-29: 3 mL

## 2013-01-29 MED ORDER — PROMETHAZINE HCL 25 MG PO TABS: 25.0000 mg | ORAL_TABLET | Freq: Four times a day (QID) | ORAL | Status: DC | PRN

## 2013-01-29 MED ORDER — PROMETHAZINE HCL 25 MG RE SUPP: 25.0000 mg | Freq: Four times a day (QID) | RECTAL | Status: DC | PRN

## 2013-01-29 MED ORDER — ACETAMINOPHEN 80 MG PO CHEW: 320.0000 mg | CHEWABLE_TABLET | ORAL | Status: DC | PRN

## 2013-01-29 MED ORDER — LIDOCAINE-EPINEPHRINE 1 %-1:100000 IJ SOLN
INTRAMUSCULAR | Status: AC
  Filled 2013-01-29: qty 1

## 2013-01-29 MED ORDER — FENTANYL CITRATE 0.05 MG/ML IJ SOLN
INTRAMUSCULAR | Status: DC | PRN
  Administered 2013-01-29 (×2): 100 ug via INTRAVENOUS

## 2013-01-29 MED ORDER — ARTIFICIAL TEARS OP OINT
TOPICAL_OINTMENT | OPHTHALMIC | Status: DC | PRN
  Administered 2013-01-29: 1 via OPHTHALMIC

## 2013-01-29 MED ORDER — DEXTROSE-NACL 5-0.9 % IV SOLN
INTRAVENOUS | Status: DC
  Administered 2013-01-29 – 2013-01-30 (×2): via INTRAVENOUS

## 2013-01-29 MED ORDER — ROCURONIUM BROMIDE 100 MG/10ML IV SOLN
INTRAVENOUS | Status: DC | PRN
  Administered 2013-01-29: 50 mg via INTRAVENOUS

## 2013-01-29 MED ORDER — LIDOCAINE HCL (CARDIAC) 20 MG/ML IV SOLN
INTRAVENOUS | Status: DC | PRN
  Administered 2013-01-29: 100 mg via INTRAVENOUS

## 2013-01-29 MED ORDER — LIDOCAINE HCL 4 % MT SOLN
OROMUCOSAL | Status: DC | PRN
  Administered 2013-01-29: 4 mL via TOPICAL

## 2013-01-29 MED ORDER — LACTATED RINGERS IV SOLN
INTRAVENOUS | Status: DC | PRN
  Administered 2013-01-29 (×2): via INTRAVENOUS

## 2013-01-29 SURGICAL SUPPLY — 43 items
ADH SKN CLS APL DERMABOND .7 (GAUZE/BANDAGES/DRESSINGS) ×1
APPLIER CLIP 9.375 SM OPEN (CLIP) ×2
APR CLP SM 9.3 20 MLT OPN (CLIP) ×1
ATTRACTOMAT 16X20 MAGNETIC DRP (DRAPES) IMPLANT
CANISTER SUCTION 2500CC (MISCELLANEOUS) ×2 IMPLANT
CLEANER TIP ELECTROSURG 2X2 (MISCELLANEOUS) ×2 IMPLANT
CLIP APPLIE 9.375 SM OPEN (CLIP) IMPLANT
CLOTH BEACON ORANGE TIMEOUT ST (SAFETY) ×2 IMPLANT
CONT SPEC 4OZ CLIKSEAL STRL BL (MISCELLANEOUS) ×2 IMPLANT
CORDS BIPOLAR (ELECTRODE) ×2 IMPLANT
COVER SURGICAL LIGHT HANDLE (MISCELLANEOUS) ×2 IMPLANT
DECANTER SPIKE VIAL GLASS SM (MISCELLANEOUS) ×2 IMPLANT
DERMABOND ADVANCED (GAUZE/BANDAGES/DRESSINGS) ×1
DERMABOND ADVANCED .7 DNX12 (GAUZE/BANDAGES/DRESSINGS) IMPLANT
DRAIN SNY 10 ROU (WOUND CARE) ×1 IMPLANT
ELECT COATED BLADE 2.86 ST (ELECTRODE) ×2 IMPLANT
ELECT REM PT RETURN 9FT ADLT (ELECTROSURGICAL) ×2
ELECTRODE REM PT RTRN 9FT ADLT (ELECTROSURGICAL) ×1 IMPLANT
EVACUATOR SILICONE 100CC (DRAIN) ×2 IMPLANT
GAUZE SPONGE 4X4 16PLY XRAY LF (GAUZE/BANDAGES/DRESSINGS) ×1 IMPLANT
GLOVE BIO SURGEON STRL SZ 6.5 (GLOVE) ×1 IMPLANT
GLOVE ECLIPSE 7.5 STRL STRAW (GLOVE) ×3 IMPLANT
GOWN PREVENTION PLUS LG XLONG (DISPOSABLE) ×6 IMPLANT
KIT BASIN OR (CUSTOM PROCEDURE TRAY) ×2 IMPLANT
KIT ROOM TURNOVER OR (KITS) ×2 IMPLANT
NEEDLE 27GAX1X1/2 (NEEDLE) ×2 IMPLANT
NS IRRIG 1000ML POUR BTL (IV SOLUTION) ×2 IMPLANT
PAD ARMBOARD 7.5X6 YLW CONV (MISCELLANEOUS) ×4 IMPLANT
PENCIL FOOT CONTROL (ELECTRODE) ×2 IMPLANT
SPECIMEN JAR MEDIUM (MISCELLANEOUS) ×1 IMPLANT
SPONGE INTESTINAL PEANUT (DISPOSABLE) ×1 IMPLANT
STAPLER VISISTAT 35W (STAPLE) ×2 IMPLANT
SUT CHROMIC 3 0 SH 27 (SUTURE) IMPLANT
SUT CHROMIC 4 0 PS 2 18 (SUTURE) ×4 IMPLANT
SUT ETHILON 3 0 PS 1 (SUTURE) ×2 IMPLANT
SUT ETHILON 5 0 P 3 18 (SUTURE) ×1
SUT NYLON ETHILON 5-0 P-3 1X18 (SUTURE) ×1 IMPLANT
SUT SILK 3 0 REEL (SUTURE) IMPLANT
SUT SILK 4 0 REEL (SUTURE) ×2 IMPLANT
TOWEL OR 17X24 6PK STRL BLUE (TOWEL DISPOSABLE) ×2 IMPLANT
TOWEL OR 17X26 10 PK STRL BLUE (TOWEL DISPOSABLE) ×2 IMPLANT
TRAY ENT MC OR (CUSTOM PROCEDURE TRAY) ×2 IMPLANT
WATER STERILE IRR 1000ML POUR (IV SOLUTION) IMPLANT

## 2013-01-29 NOTE — Anesthesia Postprocedure Evaluation (Signed)
  Anesthesia Post-op Note  Patient: Sherry Costa  Procedure(s) Performed: Procedure(s): THYROIDECTOMY COMPLETE (Left)  Patient Location: PACU  Anesthesia Type:General  Level of Consciousness: awake and alert   Airway and Oxygen Therapy: Patient Spontanous Breathing  Post-op Pain: mild  Post-op Assessment: Post-op Vital signs reviewed, Patient's Cardiovascular Status Stable, Respiratory Function Stable, Patent Airway, No signs of Nausea or vomiting and Pain level controlled  Post-op Vital Signs: stable  Complications: No apparent anesthesia complications

## 2013-01-29 NOTE — Progress Notes (Signed)
Subjective: POD#0 from completion left thyroidectomy for right micropapillary carcinoma, right follicular carcinoma, and synchronous right parathyroid adenoma. Doing well post-op, breathing stable, awake, alert.  Objective: Vital signs in last 24 hours: Temp:  [97.9 F (36.6 C)-98.3 F (36.8 C)] 98.1 F (36.7 C) (03/11 1330) Pulse Rate:  [67-107] 67 (03/11 1246) Resp:  [9-18] 16 (03/11 1246) BP: (140-185)/(77-106) 140/77 mmHg (03/11 1246) SpO2:  [98 %-100 %] 100 % (03/11 1246)  Neck supple, drain in place and holding suction, no stridor or stertor.  @LABLAST2 (wbc:2,hgb:2,hct:2,plt:2)  Recent Labs  01/29/13 1135  CALCIUM 9.0    Medications:  Scheduled Meds: . cholecalciferol  1,000 Units Oral Daily  . [START ON 01/30/2013] levothyroxine  100 mcg Oral QAC breakfast  . lisinopril  20 mg Oral Daily   Continuous Infusions: . dextrose 5 % and 0.9% NaCl 75 mL/hr at 01/29/13 1424  . lactated ringers 50 mL/hr at 01/29/13 0833   PRN Meds:.acetaminophen (TYLENOL) oral liquid 160 mg/5 mL, HYDROcodone-acetaminophen, ibuprofen, morphine injection, olopatadine, ondansetron (ZOFRAN) IV, phenol, promethazine, promethazine Assessment/Plan: Doing well POD#0 from left completion thyroid lobectomy. Monitor JP output and calcium levels (post-op calcium 9), advance diet as tolerated.   LOS: 0 days   Melvenia Beam 01/29/2013, 5:24 PM

## 2013-01-29 NOTE — Anesthesia Preprocedure Evaluation (Signed)
Anesthesia Evaluation  Patient identified by MRN, date of birth, ID band Patient awake    Reviewed: Allergy & Precautions, H&P , NPO status , Patient's Chart, lab work & pertinent test results  History of Anesthesia Complications (+) AWARENESS UNDER ANESTHESIA  Airway Mallampati: II TM Distance: >3 FB Neck ROM: Full    Dental  (+) Teeth Intact and Dental Advisory Given   Pulmonary neg pulmonary ROS,  breath sounds clear to auscultation  Pulmonary exam normal       Cardiovascular hypertension, Pt. on medications Rhythm:Regular Rate:Normal     Neuro/Psych  Headaches, negative psych ROS   GI/Hepatic negative GI ROS, Neg liver ROS,   Endo/Other  Hyperthyroidism Morbid obesity  Renal/GU negative Renal ROS  negative genitourinary   Musculoskeletal negative musculoskeletal ROS (+)   Abdominal Normal abdominal exam  (+) + obese,   Peds  Hematology negative hematology ROS (+)   Anesthesia Other Findings   Reproductive/Obstetrics negative OB ROS                           Anesthesia Physical Anesthesia Plan  ASA: III  Anesthesia Plan: General   Post-op Pain Management:    Induction: Intravenous  Airway Management Planned: Oral ETT  Additional Equipment:   Intra-op Plan:   Post-operative Plan: Extubation in OR  Informed Consent: I have reviewed the patients History and Physical, chart, labs and discussed the procedure including the risks, benefits and alternatives for the proposed anesthesia with the patient or authorized representative who has indicated his/her understanding and acceptance.     Plan Discussed with: CRNA and Surgeon  Anesthesia Plan Comments:         Anesthesia Quick Evaluation

## 2013-01-29 NOTE — Interval H&P Note (Signed)
History and Physical Interval Note:  01/29/2013 8:55 AM  Sherry Costa  has presented today for surgery, with the diagnosis of LEFT THYROID CANCER  The various methods of treatment have been discussed with the patient and family. After consideration of risks, benefits and other options for treatment, the patient has consented to  Procedure(s): THYROIDECTOMY COMPLETE (Left) as a surgical intervention .  The patient's history has been reviewed, patient examined, no change in status, stable for surgery.  I have reviewed the patient's chart and labs.  Questions were answered to the patient's satisfaction.     ROSEN, JEFRY

## 2013-01-29 NOTE — Progress Notes (Signed)
ADMITTED TO ROOM 6N27 FROM PACU, A/OX4, DENIES NAUSEA/PAIN AT THIS TIME, ORIENTED TO ROOM AND SURROUNDING, FAMILY AT BEDSIDE

## 2013-01-29 NOTE — Preoperative (Signed)
Beta Blockers   Reason not to administer Beta Blockers:Not Applicable 

## 2013-01-29 NOTE — Transfer of Care (Signed)
Immediate Anesthesia Transfer of Care Note  Patient: Sherry Costa  Procedure(s) Performed: Procedure(s): THYROIDECTOMY COMPLETE (Left)  Patient Location: PACU  Anesthesia Type:General  Level of Consciousness: awake, alert  and oriented  Airway & Oxygen Therapy: Patient Spontanous Breathing and Patient connected to face mask oxygen  Post-op Assessment: Report given to PACU RN  Post vital signs: Reviewed and stable  Complications: No apparent anesthesia complications

## 2013-01-29 NOTE — Op Note (Signed)
OPERATIVE REPORT  DATE OF SURGERY: 01/29/2013  PATIENT:  Sherry Costa,  47 y.o. female  PRE-OPERATIVE DIAGNOSIS:  THYROID CANCER  POST-OPERATIVE DIAGNOSIS:  THYROID CANCER  PROCEDURE:  Procedure(s): THYROIDECTOMY COMPLETE  SURGEON:  Susy Frizzle, MD  ASSISTANTS: Aquilla Hacker, PA  ANESTHESIA:   General   EBL:  10 ml  DRAINS: 10 French round  LOCAL MEDICATIONS USED:  Xylocaine with epinephrine  SPECIMEN:  Left thyroid lobe  COUNTS:  Correct  PROCEDURE DETAILS: The patient was taken to the operating room and placed on the operating table in the supine position. A shoulder roll was placed beneath the shoulder blades and the neck was extended. The neck was prepped and draped in a standard fashion. A low collar transverse incision was outlined marking pen and was incised with a 15 scalpel following infiltration of local anesthetic solution. Dissection was continued down through the platysma layer. Subplatysmal flaps were elevated superiorly to the thyroid cartilage and inferiorly to the clavicle. Self-retaining thyroid retractor was used throughout the case.  The midline fascia was divided. The left lobe was identified and exposed. The left lobe was retracted medially while the strap muscles were reflected laterally. Careful dissection then ensued around the capsule. A superior parathyroid was identified and carefully preserved with its blood supply. The recurrent nerve was identified as well entering into the larynx. The superior vasculature was separately identified, ligated between clamps and divided. Dissection down along the inferior pole revealed multiple vessels that were treated in a similar fashion. The lobe was dissected off of the trachea and sent for pathologic evaluation. There were no other palpable nodes. There were several small firm nodules within the left lobe. 4-0 silk ties were used as needed for hemostasis. Bipolar cautery was used as well. The wound was  irrigated with saline. 4-0 chromic suture was used to reapproximate the midline fascia and the subplatysmal layer. A subcuticular closure was also accomplished. Dermabond was used on the skin. The drain was placed in the wound and exited to the right side of the incision, secured in place with nylon suture. She was awakened, extubated and transferred to recovery in stable condition.   PATIENT DISPOSITION:  To PACU, stable

## 2013-01-30 LAB — CALCIUM: Calcium: 8.9 mg/dL (ref 8.4–10.5)

## 2013-01-30 NOTE — Discharge Summary (Signed)
Physician Discharge Summary  Patient ID: Sherry Costa MRN: [id number] DOB/AGE: Jan 03, 1966 47 y.o.  Admit date: 01/29/2013 Discharge date: 01/30/2013  Admission Diagnoses:Thyroid cancer  Discharge Diagnoses:  Active Problems:   * No active hospital problems. *   Discharged Condition: good  Hospital Course: no complications  Consults: none  Significant Diagnostic Studies: none  Treatments: surgery: Completion thyroidectomy  Discharge Exam: Blood pressure 135/79, pulse 65, temperature 98 F (36.7 C), temperature source Oral, resp. rate 16, SpO2 96.00%. PHYSICAL EXAM: Voice strong, JP removed.   Disposition: 01-Home or Self Care  Discharge Orders   Future Appointments Provider Department Dept Phone   02/18/2013 2:00 PM Shade Flood, MD [facility] [phone]   Future Orders Complete By Expires     Diet - low sodium heart healthy  As directed     Increase activity slowly  As directed         Medication List    TAKE these medications       cholecalciferol 1000 UNITS tablet  Commonly known as:  VITAMIN D  Take 1,000 Units by mouth daily.     HYDROcodone-acetaminophen 7.5-325 MG per tablet  Commonly known as:  NORCO  Take 1 tablet by mouth every 6 (six) hours as needed for pain.     levothyroxine 100 MCG tablet  Commonly known as:  SYNTHROID  Take 1 tablet (100 mcg total) by mouth daily.     lisinopril 20 MG tablet  Commonly known as:  PRINIVIL,ZESTRIL  Take 1 tablet (20 mg total) by mouth daily.     olopatadine 0.1 % ophthalmic solution  Commonly known as:  PATANOL  Place 1 drop into both eyes 2 (two) times daily as needed. For itchy eyes.     promethazine 25 MG suppository  Commonly known as:  PHENERGAN  Place 1 suppository (25 mg total) rectally every 6 (six) hours as needed for nausea.           Follow-up Information   Follow up with Serena Colonel, MD. Schedule an appointment as soon as possible for a visit in 1 week.   Contact information:   [address], [address] [place], [address] [place] [address] [phone]       Signed: Serena Colonel 01/30/2013, 8:53 AM

## 2013-01-30 NOTE — Progress Notes (Signed)
Pt discharged to home

## 2013-02-02 ENCOUNTER — Encounter (HOSPITAL_COMMUNITY): Payer: Self-pay | Admitting: Otolaryngology

## 2013-02-18 ENCOUNTER — Ambulatory Visit (INDEPENDENT_AMBULATORY_CARE_PROVIDER_SITE_OTHER): Payer: BC Managed Care – PPO | Admitting: Family Medicine

## 2013-02-18 ENCOUNTER — Encounter: Payer: Self-pay | Admitting: Family Medicine

## 2013-02-18 VITALS — BP 150/110 | HR 83 | Temp 98.0°F | Resp 16 | Ht 63.0 in | Wt 215.4 lb

## 2013-02-18 DIAGNOSIS — C73 Malignant neoplasm of thyroid gland: Secondary | ICD-10-CM

## 2013-02-18 DIAGNOSIS — I10 Essential (primary) hypertension: Secondary | ICD-10-CM

## 2013-02-18 LAB — BASIC METABOLIC PANEL
BUN: 12 mg/dL (ref 6–23)
CO2: 29 mEq/L (ref 19–32)
Calcium: 10 mg/dL (ref 8.4–10.5)
Glucose, Bld: 84 mg/dL (ref 70–99)
Sodium: 135 mEq/L (ref 135–145)

## 2013-02-18 MED ORDER — HYDROCHLOROTHIAZIDE 12.5 MG PO CAPS: 12.5000 mg | ORAL_CAPSULE | Freq: Every day | ORAL | Status: DC

## 2013-02-18 NOTE — Progress Notes (Signed)
Subjective:    Patient ID: Sherry Costa, female    DOB: 27-Jun-1966, 47 y.o.   MRN: [id number]  HPI Sherry Costa is a 47 y.o. female S/p completion left thyroidectomy for right micropapillary carcinoma, right follicular carcinoma, and synchronous right parathyroid adenoma on 01/29/13 - Dr. Pollyann Kennedy. Now on synthroid qd. Followed by Dr. Talmage Nap prior as well - appt with her tomorrow.   Voice is hoarse sometimes, has discussed with Dr. Pollyann Kennedy - improved today. Returns to work in 2 days. A little anxious about this.  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 Has been out of work for few weeks, denies depression but in downtime - finds self checking locks, doors more.   HTN - out of meds for 1 week prior to last ov (01/07/13). Restarted lisinopril 20mg  qd.   Creat 1.23 on 01/23/13, up from 0.95 1 month ago.  No outside Bp's recently.   Some slight elevation in hospital, placed on low sodium diet.  Busy day - went to [facility]. Doesn't feel great today - tired. No fevers. Walking on treadmill. 132/71 to 152/84 in hospital.  No change in meds. Some anxious today.  Hx of white coat elevn.   No further constipation or abd pain as discussed at last ov.   Review of Systems  Constitutional: Negative for fatigue and unexpected weight change.  Respiratory: Negative for chest tightness and shortness of breath.   Cardiovascular: Negative for chest pain, palpitations and leg swelling.  Gastrointestinal: Negative for abdominal pain and blood in stool.  Neurological: Negative for dizziness, syncope, light-headedness and headaches (rare. ).       Objective:   Physical Exam  Constitutional: She is oriented to person, place, and time. She appears well-developed and well-nourished.  HENT:  Head: Normocephalic and atraumatic.  Eyes: Conjunctivae and EOM are normal. Pupils are equal, round, and reactive to light.  Neck: Trachea normal. Carotid bruit is not present.    Cardiovascular: Normal rate, regular rhythm, normal heart  sounds and intact distal pulses.   Pulmonary/Chest: Effort normal and breath sounds normal.  Abdominal: Soft. She exhibits no pulsatile midline mass. There is no tenderness.  Neurological: She is alert and oriented to person, place, and time.  Skin: Skin is warm and dry.  Psychiatric: She has a normal mood and affect. Her behavior is normal.      Assessment & Plan:  Sherry Costa is a 47 y.o. female Follicular thyroid cancer - s/p thyroidectomy. Follow up with Dr. Talmage Nap and Pollyann Kennedy as scheduled.   HTN (hypertension) - Plan: Basic metabolic panel, hydrochlorothiazide (MICROZIDE) 12.5 MG capsule.- elevated today - few elevated readings in hospital. Check outside BP's as possible white coat component and if elevated - ad HCTZ as below. rtc precautions.   Anxiety - situational with recent surgery and being out of work. Suspect improvement with returning to usual work and routine, but if compulsive sx's or anxiety persists, can discuss further in next few weeks at follow up.   Patient Instructions  Your pressure is elevated more than prior today.  This could be in part to "white coat" syndrome, or we may need to add the hydrochlorothiazide as discussed. Keep a record of your blood pressures outside of the office and bring them to the next office visit.  If your pressure remains over 140 over 90 in the next few days- add the new medicine to your lisinopril. Recheck in next 2 weeks.  Return to the clinic or go to the nearest emergency  room if any of your symptoms worsen or new symptoms occur.    Meds ordered this encounter  Medications  . hydrochlorothiazide (MICROZIDE) 12.5 MG capsule    Sig: Take 1 capsule (12.5 mg total) by mouth daily.    Dispense:  30 capsule    Refill:  1

## 2013-02-18 NOTE — Patient Instructions (Signed)
Your pressure is elevated more than prior today.  This could be in part to "white coat" syndrome, or we may need to add the hydrochlorothiazide as discussed. Keep a record of your blood pressures outside of the office and bring them to the next office visit.  If your pressure remains over 140 over 90 in the next few days- add the new medicine to your lisinopril. Recheck in next 2 weeks.  Return to the clinic or go to the nearest emergency room if any of your symptoms worsen or new symptoms occur.

## 2013-02-21 ENCOUNTER — Other Ambulatory Visit: Payer: Self-pay | Admitting: Endocrinology

## 2013-02-21 DIAGNOSIS — C73 Malignant neoplasm of thyroid gland: Secondary | ICD-10-CM

## 2013-03-11 ENCOUNTER — Ambulatory Visit: Payer: BC Managed Care – PPO | Admitting: Family Medicine

## 2013-03-11 ENCOUNTER — Encounter (HOSPITAL_COMMUNITY)
Admission: RE | Admit: 2013-03-11 | Discharge: 2013-03-11 | Disposition: A | Discharge status: Home / Self Care | Payer: BC Managed Care – PPO | Source: Ambulatory Visit | Attending: Endocrinology | Admitting: Endocrinology

## 2013-03-11 DIAGNOSIS — C73 Malignant neoplasm of thyroid gland: Secondary | ICD-10-CM | POA: Insufficient documentation

## 2013-03-11 MED ORDER — THYROTROPIN ALFA 1.1 MG IM SOLR
0.9000 mg | INTRAMUSCULAR | Status: AC
  Administered 2013-03-11: 0.9 mg via INTRAMUSCULAR
  Filled 2013-03-11: qty 0.9

## 2013-03-12 ENCOUNTER — Encounter (HOSPITAL_COMMUNITY)
Admission: RE | Admit: 2013-03-12 | Discharge: 2013-03-12 | Disposition: A | Discharge status: Home / Self Care | Payer: BC Managed Care – PPO | Source: Ambulatory Visit | Attending: Endocrinology | Admitting: Endocrinology

## 2013-03-12 MED ORDER — THYROTROPIN ALFA 1.1 MG IM SOLR
0.9000 mg | INTRAMUSCULAR | Status: AC
  Administered 2013-03-12: 0.9 mg via INTRAMUSCULAR
  Filled 2013-03-12: qty 0.9

## 2013-03-13 ENCOUNTER — Encounter (HOSPITAL_COMMUNITY)
Admission: RE | Admit: 2013-03-13 | Discharge: 2013-03-13 | Disposition: A | Discharge status: Home / Self Care | Payer: BC Managed Care – PPO | Source: Ambulatory Visit | Attending: Endocrinology | Admitting: Endocrinology

## 2013-03-13 DIAGNOSIS — C73 Malignant neoplasm of thyroid gland: Secondary | ICD-10-CM | POA: Insufficient documentation

## 2013-03-13 MED ORDER — SODIUM IODIDE I 131 CAPSULE
102.0000 | Freq: Once | INTRAVENOUS | Status: AC | PRN
  Administered 2013-03-13: 102 via ORAL

## 2013-03-20 ENCOUNTER — Encounter (HOSPITAL_COMMUNITY)
Admission: RE | Admit: 2013-03-20 | Discharge: 2013-03-20 | Disposition: A | Discharge status: Home / Self Care | Payer: BC Managed Care – PPO | Source: Ambulatory Visit | Attending: Endocrinology | Admitting: Endocrinology

## 2013-03-20 ENCOUNTER — Encounter (HOSPITAL_COMMUNITY): Payer: Self-pay

## 2013-03-20 DIAGNOSIS — C73 Malignant neoplasm of thyroid gland: Secondary | ICD-10-CM | POA: Insufficient documentation

## 2013-04-01 ENCOUNTER — Ambulatory Visit (INDEPENDENT_AMBULATORY_CARE_PROVIDER_SITE_OTHER): Payer: BC Managed Care – PPO | Admitting: Family Medicine

## 2013-04-01 ENCOUNTER — Encounter: Payer: Self-pay | Admitting: Family Medicine

## 2013-04-01 VITALS — BP 160/97 | HR 76 | Temp 98.1°F | Resp 18 | Ht 63.5 in | Wt 211.0 lb

## 2013-04-01 DIAGNOSIS — I10 Essential (primary) hypertension: Secondary | ICD-10-CM

## 2013-04-01 DIAGNOSIS — L980 Pyogenic granuloma: Secondary | ICD-10-CM

## 2013-04-01 MED ORDER — HYDROCHLOROTHIAZIDE 25 MG PO TABS: 25.0000 mg | ORAL_TABLET | Freq: Every day | ORAL | Status: AC

## 2013-04-01 NOTE — Patient Instructions (Signed)
The area on your wrist appears to be a pyogenic granuloma. We can try tom remove this in the office - walk into [address] [facility] at your convenience, but for now keep it covered with a bandage to decrease the risk of bleeding.  Return to the clinic or go to the nearest emergency room if any of your symptoms worsen or new symptoms occur. For our blood pressure - Keep a record of your blood pressures outside of the office and bring them to the next office visit. We will increase the HCTZ to 25mg  each day.

## 2013-04-01 NOTE — Progress Notes (Signed)
Subjective:    Patient ID: Sherry Costa, female    DOB: Apr 23, 1966, 47 y.o.   MRN: [id number]  HPI Sherry Costa is a 47 y.o. female  S/p completion left thyroidectomy for right micropapillary carcinoma, right follicular carcinoma, and synchronous right parathyroid adenoma on 01/29/13 - Dr. Pollyann Kennedy. Now on synthroid qd. Followed by Dr. Talmage Nap  as well.  HTN - see prior ov's.  Possible white coat component to HTN in past.Taking HCTZ 12.5mg  QD.  Not checking outside blood pressures as discussed at last ov, but did have elevated reading at endocrine office.  Having issues with trying to control thyroid meds - changing these by Dr. Talmage Nap - labwork ok, but side effects with meds - Dr. Talmage Nap managing this. Had lost some weight (11 pounds in 1 month) with low sodium diet before radiation, then stopped low sodium diet, and now working out. Had been instructed to stop meds for few days, then changed from to 125 mcg? 7 pounds gained this past week. No chest pain, no dyspnea. No orthopnea. No PND.   Bump on R forearm past month - unknown onset/cause. Did start as a hair bump and picked at it some. Tried using wart remover. Bumped in shower this am - started to bleed. No surrounding redness.  Results for orders placed in visit on 02/18/13  BASIC METABOLIC PANEL      Result Value Range   Sodium 135  135 - 145 mEq/L   Potassium 4.1  3.5 - 5.3 mEq/L   Chloride 100  96 - 112 mEq/L   CO2 29  19 - 32 mEq/L   Glucose, Bld 84  70 - 99 mg/dL   BUN 12  6 - 23 mg/dL   Creat 8.29  5.62 - 1.30 mg/dL   Calcium 86.5  8.4 - 78.4 mg/dL   Review of Systems  Constitutional: Positive for unexpected weight change (gained 7 pounds - this past week. ). Negative for fatigue.  Respiratory: Negative for chest tightness and shortness of breath.   Cardiovascular: Negative for chest pain, palpitations and leg swelling.  Gastrointestinal: Negative for abdominal pain and blood in stool.  Neurological: Negative  for dizziness, syncope, light-headedness and headaches.       Objective:   Physical Exam  Vitals reviewed. Constitutional: She is oriented to person, place, and time. She appears well-developed and well-nourished.  HENT:  Head: Normocephalic and atraumatic.  Eyes: Conjunctivae and EOM are normal. Pupils are equal, round, and reactive to light.  Neck: Carotid bruit is not present.  Cardiovascular: Normal rate, regular rhythm, normal heart sounds and intact distal pulses.  Exam reveals no gallop and no friction rub.   No murmur heard. Pulmonary/Chest: Effort normal and breath sounds normal. No respiratory distress. She has no wheezes. She has no rales.  Abdominal: Soft. She exhibits no pulsatile midline mass. There is no tenderness.  Musculoskeletal: She exhibits edema (trace pedal. ).  Neurological: She is alert and oriented to person, place, and time.  Skin: Skin is warm and dry. Lesion noted.     Psychiatric: She has a normal mood and affect. Her behavior is normal.      Assessment & Plan:  [name] is a 47 y.o. female HTN (hypertension) - Plan: hydrochlorothiazide (HYDRODIURIL) 25 MG tablet. Suspect that even with component of white coat HTN, she is still elevated. Will increase the HCTZ to 25mg  QD, recommended to check home BP's, and orthostatic precautions discussed.   Continue follow  up with endocrine re: med doses, and weight changes. rtc precautions.   Pyogenic granuloma of skin - R forearm.  Discussed need for removal likely by shave excision with  cautery of base, and probable path eval of specimen for best option in affected area.  Can schedule this by appt, or rtc at 102 for procedure. Keep bandage over area for now as friable and easy to bleed. Understanding expressed, and reviewed plan on phone after ov.   Meds ordered this encounter           . hydrochlorothiazide (HYDRODIURIL) 25 MG tablet    Sig: Take 1 tablet (25 mg total) by mouth daily.    Dispense:   90 tablet    Refill:  1     Patient Instructions  The area on your wrist appears to be a pyogenic granuloma. We can try tom remove this in the office - walk into [address] at your convenience, but for now keep it covered with a bandage to decrease the risk of bleeding.  Return to the clinic or go to the nearest emergency room if any of your symptoms worsen or new symptoms occur. For our blood pressure - Keep a record of your blood pressures outside of the office and bring them to the next office visit. We will increase the HCTZ to 25mg  each day.

## 2013-04-07 ENCOUNTER — Ambulatory Visit (INDEPENDENT_AMBULATORY_CARE_PROVIDER_SITE_OTHER): Payer: BC Managed Care – PPO | Admitting: Emergency Medicine

## 2013-04-07 VITALS — BP 91/57 | HR 93 | Temp 98.6°F | Resp 16 | Ht 63.2 in | Wt 214.0 lb

## 2013-04-07 DIAGNOSIS — L980 Pyogenic granuloma: Secondary | ICD-10-CM

## 2013-04-07 DIAGNOSIS — I781 Nevus, non-neoplastic: Secondary | ICD-10-CM

## 2013-04-07 NOTE — Progress Notes (Signed)
   Patient ID: CAMY LEDER MRN: [id number], DOB: 1966-05-29, 47 y.o. Date of Encounter: 04/07/2013, 9:34 AM   PROCEDURE NOTE: Verbal consent obtained. Sterile technique employed. Numbing: Local anesthesia obtained with 2cc of 1% lidocaine with epinephrine.  Betadine prep per usual protocol.  Excision biopsy taken from dorsal surface of right distal forearm.  Biopsy placed in pathology transport medium, labeled, and form completed. Hemostasis obtained with silver nitrate. Wound cleansed and dressed. Wound care instructions including precautions covered with patient.   Signed, Eula Listen, PA-C 04/07/2013 9:34 AM

## 2013-04-07 NOTE — Addendum Note (Signed)
Addended by: Carmelina Dane on: 04/07/2013 09:02 AM   Modules accepted: Orders

## 2013-04-07 NOTE — Progress Notes (Signed)
[facility] [address], [place] [address] [phone]  Date:  04/07/2013   Name:  Sherry Costa   DOB:  March 06, 1966   MRN:  [id number]  PCP:  Shade Flood, MD    Chief Complaint: bump on right wrist   History of Present Illness:  Sherry Costa is a 47 y.o. very pleasant female patient who presents with the following:  Has lesion on right wrist referred for removal by Dr Neva Seat.  Some occasional bleeding with minor trauma.  Patient says has popped up over past month or two.  No improvement with over the counter medications or other home remedies. Denies other complaint or health concern today.   Patient Active Problem List   Diagnosis Date Noted  . Follicular thyroid cancer 01/07/2013  . HTN (hypertension) 01/07/2013    Past Medical History  Diagnosis Date  . Hypertension   . Hyperthyroidism   . Hypercalcemia   . Multiple thyroid nodules   . Complication of anesthesia 1987    woke up during surgery during cyst removal  has not happended since  . Sinus headache   . Cancer     thyroid cancer    Past Surgical History  Procedure Laterality Date  . Cesarean section  1994  . Endometrial ablation  ~ 2008  . Parathyroidectomy  11/09/2012  . Cervix surgery  1987    "cyst removed" (11/09/2012)  . Vaginal hysterectomy  2011  . Total thyroidectomy  11/09/2012  . Thyroidectomy  11/09/2012    Procedure: THYROIDECTOMY;  Surgeon: Serena Colonel, MD;  Location: [facility] OR;  Service: ENT;  Laterality: Right;  RIGHT THYROID LOBECTOMY  AND PARATHRYOIDECTOMY WITH FROZEN SECTION  . Tubal ligation  1997  . Thyroidectomy Left 01/29/2013    Procedure: THYROIDECTOMY COMPLETE;  Surgeon: Serena Colonel, MD;  Location: [facility] OR;  Service: ENT;  Laterality: Left;    History  Substance Use Topics  . Smoking status: Never Smoker   . Smokeless tobacco: Never Used  . Alcohol Use: No    Family History  Problem Relation Age of Onset  . Cancer Father     gallbladder     No Known Allergies  Medication list has been reviewed and updated.  Current Outpatient Prescriptions on File Prior to Visit  Medication Sig Dispense Refill  . cholecalciferol (VITAMIN D) 1000 UNITS tablet Take 1,000 Units by mouth daily.      . hydrochlorothiazide (HYDRODIURIL) 25 MG tablet Take 1 tablet (25 mg total) by mouth daily.  90 tablet  1  . levothyroxine (SYNTHROID, LEVOTHROID) 100 MCG tablet Take 125 mcg by mouth daily.      Marland Kitchen olopatadine (PATANOL) 0.1 % ophthalmic solution Place 1 drop into both eyes 2 (two) times daily as needed. For itchy eyes.      Marland Kitchen HYDROcodone-acetaminophen (NORCO) 7.5-325 MG per tablet Take 1 tablet by mouth every 6 (six) hours as needed for pain.  30 tablet  0  . promethazine (PHENERGAN) 25 MG suppository Place 1 suppository (25 mg total) rectally every 6 (six) hours as needed for nausea.  12 each  0   No current facility-administered medications on file prior to visit.    Review of Systems:  As per HPI, otherwise negative.    Physical Examination: Filed Vitals:   04/07/13 0806  BP: 91/57  Pulse: 93  Temp: 98.6 F (37 C)  Resp: 16   Filed Vitals:   04/07/13 0806  Height: 5' 3.2" (1.605 m)  Weight: 214 lb (97.07 kg)   Body mass index is 37.68 kg/(m^2). Ideal Body Weight: Weight in (lb) to have BMI = 25: 141.7   GEN: WDWN, NAD, Non-toxic, Alert & Oriented x 3 HEENT: Atraumatic, Normocephalic.  Ears and Nose: No external deformity. EXTR: No clubbing/cyanosis/edema NEURO: Normal gait.  PSYCH: Normally interactive. Conversant. Not depressed or anxious appearing.  Calm demeanor.  Looks like an underlying nevus with a capillary hemangioma or granulation tissue that is very friable.    Assessment and Plan: Benign lesion Excision and path     Signed,  Phillips Odor, MD

## 2013-05-27 ENCOUNTER — Telehealth: Payer: Self-pay

## 2013-05-27 NOTE — Telephone Encounter (Signed)
Patient would like her blood pressure pills changed. States she feels constipated and strange when she takes them.  Best number: [phone]

## 2013-05-27 NOTE — Telephone Encounter (Signed)
She is on HCTZ/ feels bad on this. Her BP has been " good " she does not know the numbers. She states she is constipated. Please advise.

## 2013-05-28 NOTE — Telephone Encounter (Signed)
HCTZ is unlikely to be causing constipation.  Would encourage her to increase water intake, fiber intake.  Can try OTC Miralax or stool softener.  Ok to d/c HCTZ if she is not feeling well while using this med.  Would encourage her to check home BPs at least 3 times per week and keep a record of this.  If BPs continue to be elevated (>140/90) while off HCTZ, will need her to RTC to discuss other medications for getting her BP down

## 2013-05-29 NOTE — Telephone Encounter (Signed)
Patient advised.  She states she will try taking 1/2 of the pill, which was her previous dose and she will come back in if her BP continues to be elevated, to you [facility]

## 2013-06-03 ENCOUNTER — Other Ambulatory Visit: Payer: Self-pay | Admitting: Obstetrics and Gynecology

## 2013-06-28 ENCOUNTER — Encounter: Payer: Self-pay | Admitting: Radiology

## 2013-11-06 ENCOUNTER — Emergency Department (HOSPITAL_COMMUNITY)
Admission: EM | Admit: 2013-11-06 | Discharge: 2013-11-06 | Disposition: A | Discharge status: Home / Self Care | Payer: BC Managed Care – PPO | Source: Home / Self Care

## 2013-11-06 ENCOUNTER — Encounter (HOSPITAL_COMMUNITY): Payer: Self-pay | Admitting: Emergency Medicine

## 2013-11-06 DIAGNOSIS — R51 Headache: Secondary | ICD-10-CM

## 2013-11-06 DIAGNOSIS — I10 Essential (primary) hypertension: Secondary | ICD-10-CM

## 2013-11-06 MED ORDER — ONDANSETRON 4 MG PO TBDP
4.0000 mg | ORAL_TABLET | Freq: Once | ORAL | Status: AC
  Administered 2013-11-06: 4 mg via ORAL

## 2013-11-06 MED ORDER — KETOROLAC TROMETHAMINE 60 MG/2ML IM SOLN
INTRAMUSCULAR | Status: AC
  Filled 2013-11-06: qty 2

## 2013-11-06 MED ORDER — ONDANSETRON 4 MG PO TBDP
ORAL_TABLET | ORAL | Status: AC
  Filled 2013-11-06: qty 1

## 2013-11-06 MED ORDER — KETOROLAC TROMETHAMINE 60 MG/2ML IM SOLN
60.0000 mg | Freq: Once | INTRAMUSCULAR | Status: AC
  Administered 2013-11-06: 60 mg via INTRAMUSCULAR

## 2013-11-06 NOTE — ED Provider Notes (Signed)
CSN: [id number]     Arrival date & time 11/06/13  1142 History   None    Chief Complaint  Patient presents with  . Headache   (Consider location/radiation/quality/duration/timing/severity/associated sxs/prior Treatment) HPI Comments: 47 -year-old female with a history of hypertension and medical noncompliance. She was at the optometrist today when they took her blood pressure found it to be elevated and decided they would not be able to perform a routine eye exam. The patient awoke this morning with a left-sided headache which is not unusual for her. It is associated with intermittent mild nausea. She denies problems with vision speech hearing or swallowing. Denies focal paresthesias or weakness. She usually goes to sleep when she has a headache and has helps it to abate. Interestingly, she saw her PCP that treats her BP yesterday. Her blood pressure was high then but according to the patient was decided that the patient just needed to take her medicines on a daily basis rather than change medical management. Denies chest pain, shortness of breath, palpitations or other neurologic complaints. She is fully awake, alert and oriented with normal speech, content, orientation and nl fund of knowledge.    Past Medical History  Diagnosis Date  . Hypertension   . Hyperthyroidism   . Hypercalcemia   . Multiple thyroid nodules   . Complication of anesthesia 1987    woke up during surgery during cyst removal  has not happended since  . Sinus headache   . Cancer     thyroid cancer  . Hyperparathyroidism 08/2012    treated by Dr Dorisann Frames   Past Surgical History  Procedure Laterality Date  . Cesarean section  1994  . Endometrial ablation  ~ 2008  . Parathyroidectomy  11/09/2012  . Cervix surgery  1987    "cyst removed" (11/09/2012)  . Vaginal hysterectomy  2011  . Total thyroidectomy  11/09/2012  . Thyroidectomy  11/09/2012    Procedure: THYROIDECTOMY;  Surgeon: Serena Colonel, MD;   Location: [facility] OR;  Service: ENT;  Laterality: Right;  RIGHT THYROID LOBECTOMY  AND PARATHRYOIDECTOMY WITH FROZEN SECTION  . Tubal ligation  1997  . Thyroidectomy Left 01/29/2013    Procedure: THYROIDECTOMY COMPLETE;  Surgeon: Serena Colonel, MD;  Location: [facility] OR;  Service: ENT;  Laterality: Left;   Family History  Problem Relation Age of Onset  . Cancer Father     gallbladder   History  Substance Use Topics  . Smoking status: Never Smoker   . Smokeless tobacco: Never Used  . Alcohol Use: No   OB History   Grav Para Term Preterm Abortions TAB SAB Ect Mult Living                 Review of Systems  Constitutional: Positive for activity change. Negative for fever.  HENT: Negative for congestion, hearing loss, postnasal drip, rhinorrhea and sore throat.   Eyes: Negative for discharge, redness, itching and visual disturbance.  Respiratory: Negative for cough, chest tightness, shortness of breath and stridor.   Cardiovascular: Negative for chest pain, palpitations and leg swelling.  Gastrointestinal: Positive for nausea. Negative for vomiting.  Genitourinary: Negative.   Musculoskeletal: Negative.   Skin: Negative for rash.  Neurological: Positive for headaches. Negative for dizziness, tremors, seizures, syncope, facial asymmetry, speech difficulty, light-headedness and numbness.  Psychiatric/Behavioral: Negative.     Allergies  Review of patient's allergies indicates no known allergies.  Home Medications   Current Outpatient Rx  Name  Route  Sig  Dispense  Refill  . cholecalciferol (VITAMIN D) 1000 UNITS tablet   Oral   Take 1,000 Units by mouth daily.         . hydrochlorothiazide (HYDRODIURIL) 25 MG tablet   Oral   Take 1 tablet (25 mg total) by mouth daily.   90 tablet   1   . HYDROcodone-acetaminophen (NORCO) 7.5-325 MG per tablet   Oral   Take 1 tablet by mouth every 6 (six) hours as needed for pain.   30 tablet   0   . levothyroxine (SYNTHROID, LEVOTHROID) 100  MCG tablet   Oral   Take 125 mcg by mouth daily.         Marland Kitchen olopatadine (PATANOL) 0.1 % ophthalmic solution   Both Eyes   Place 1 drop into both eyes 2 (two) times daily as needed. For itchy eyes.         . promethazine (PHENERGAN) 25 MG suppository   Rectal   Place 1 suppository (25 mg total) rectally every 6 (six) hours as needed for nausea.   12 each   0    BP 182/124  Pulse 91  Temp(Src) 98.5 F (36.9 C) (Oral)  Resp 20  SpO2 98% Physical Exam  Nursing note and vitals reviewed. Constitutional: She is oriented to person, place, and time. She appears well-developed and well-nourished. No distress.  HENT:  Head: Normocephalic and atraumatic.  Mouth/Throat: Oropharynx is clear and moist. No oropharyngeal exudate.  Bilateral TMs are normal  Eyes: Conjunctivae and EOM are normal. Pupils are equal, round, and reactive to light.  Neck: Normal range of motion. Neck supple.  Cardiovascular: Normal rate, regular rhythm and normal heart sounds.   Pulmonary/Chest: Effort normal and breath sounds normal. No respiratory distress. She has no wheezes. She has no rales.  Abdominal: Soft. There is no tenderness.  Musculoskeletal: Normal range of motion.  Lymphadenopathy:    She has no cervical adenopathy.  Neurological: She is alert and oriented to person, place, and time. She has normal strength. She displays no tremor. No cranial nerve deficit or sensory deficit. She exhibits normal muscle tone. She displays a negative Romberg sign. She displays no seizure activity. Gait normal.  Heel to toe: Falls out of line. (Patient states she is usually clumsy).  Skin: Skin is warm and dry.  Psychiatric: She has a normal mood and affect.    ED Course  Procedures (including critical care time) Labs Review Labs Reviewed - No data to display Imaging Review No results found.    MDM   1. HTN (hypertension)   2. Headache     Pt is stable , No new neurologic events or cardiopulmonary  sx's.  Cont medication daily Call your PCP tomorrow and let her know of todays events Toradol 60 IM and zofran 4 mg po, then D/C home to sleep. For worsening or new sx's go to the ED.   Hayden Rasmussen, NP 11/06/13 [phone]

## 2013-11-06 NOTE — ED Notes (Signed)
History of HBP, inconsistent use of BP medication and thyroid medication;  States she was okay last night, but woke w HA; went to eye MD appt thIs am, AND THEY REFUSED TO DO HER exam due to her BP readings

## 2013-11-07 NOTE — ED Provider Notes (Signed)
Medical screening examination/treatment/procedure(s) were performed by a resident physician or non-physician practitioner and as the supervising physician I was immediately available for consultation/collaboration.  Clementeen Graham, MD    Rodolph Bong, MD 11/07/13 [phone]

## 2013-11-11 IMAGING — US US SOFT TISSUE HEAD/NECK
1 series · 14 of 25 positions shown · non-contrast
Comparison: None.

CLINICAL DATA: History of thyroid goiter

THYROID ULTRASOUND
TECHNIQUE: Ultrasound examination of the thyroid gland and adjacent
soft tissues was performed.

[Series 1: us soft tissue head/neck · 0.08mm/px · 14 of 68 slices shown]
[im 1/68]
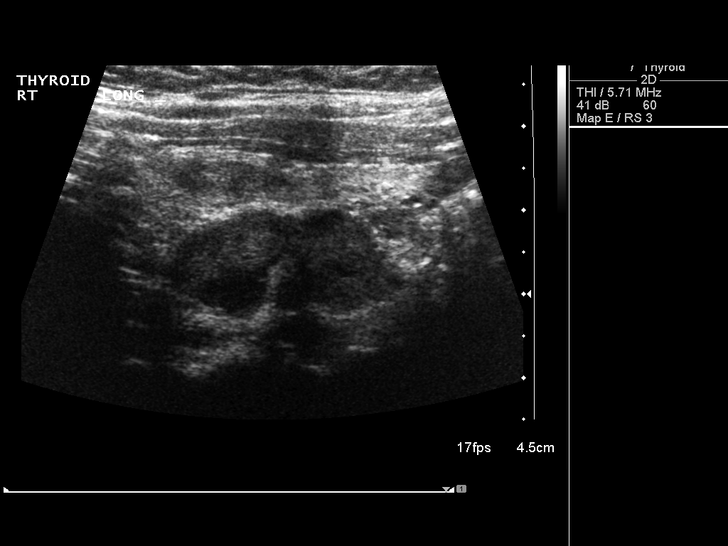
[im 6/68]
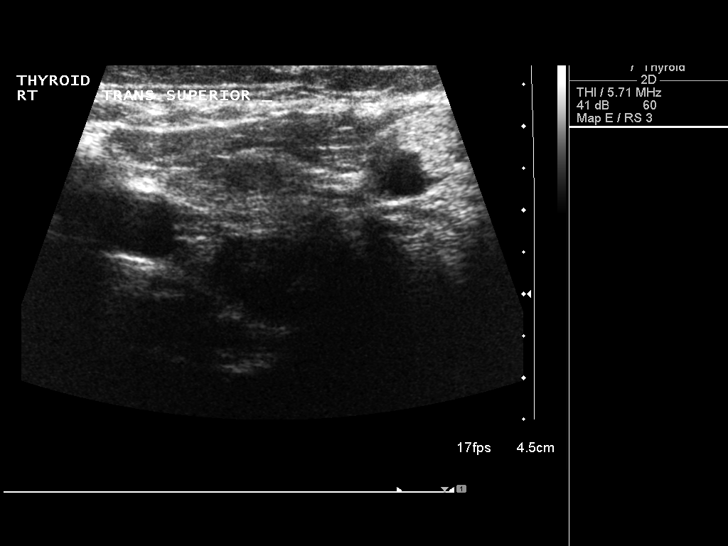
[im 12/68]
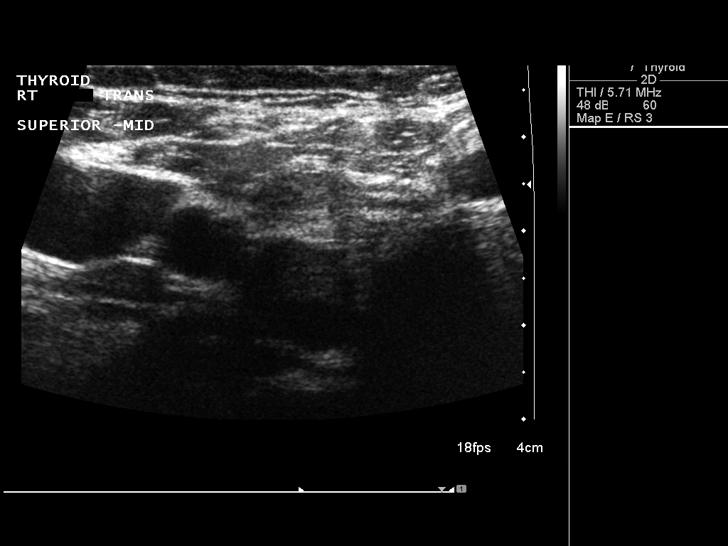
[im 17/68]
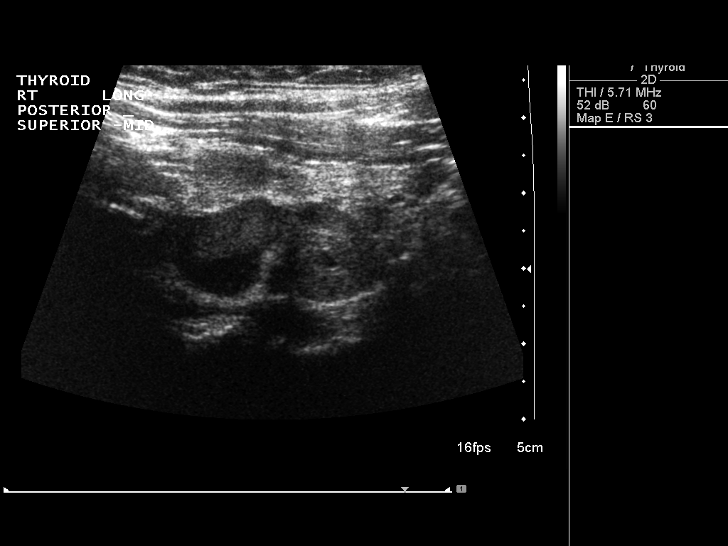
[im 23/68]
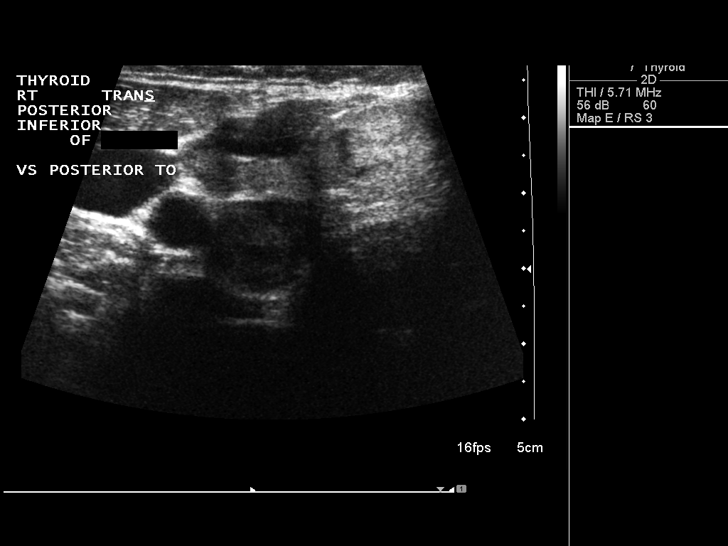
[im 26/68]
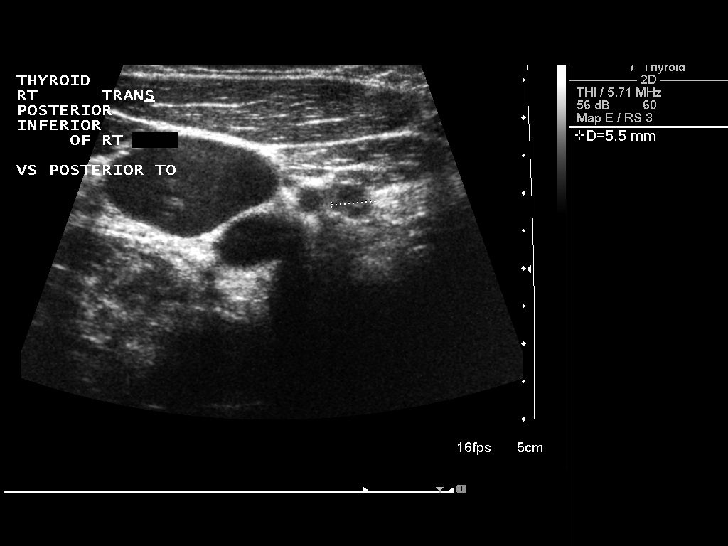
[im 31/68]
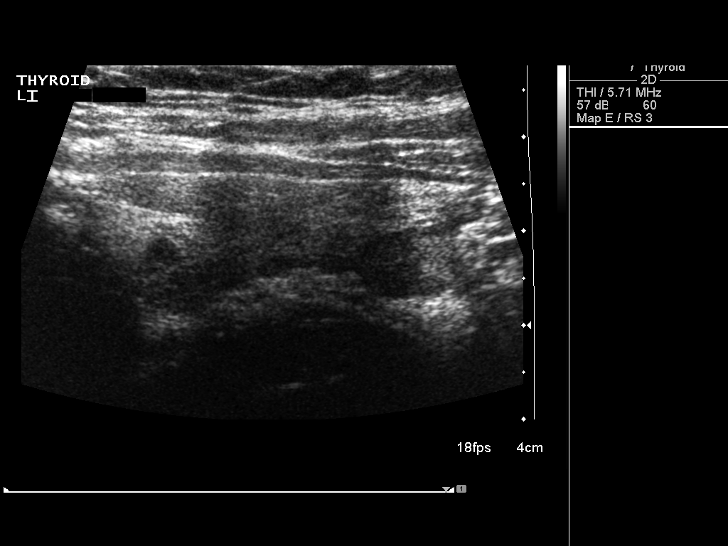
[im 37/68]
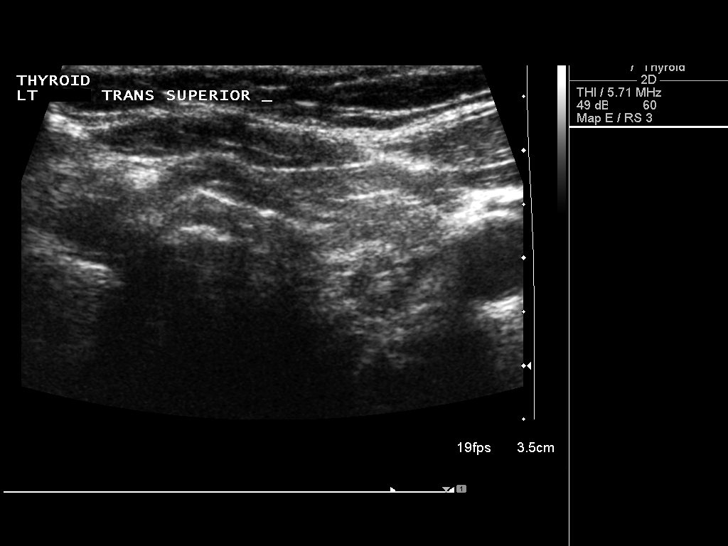
[im 42/68]
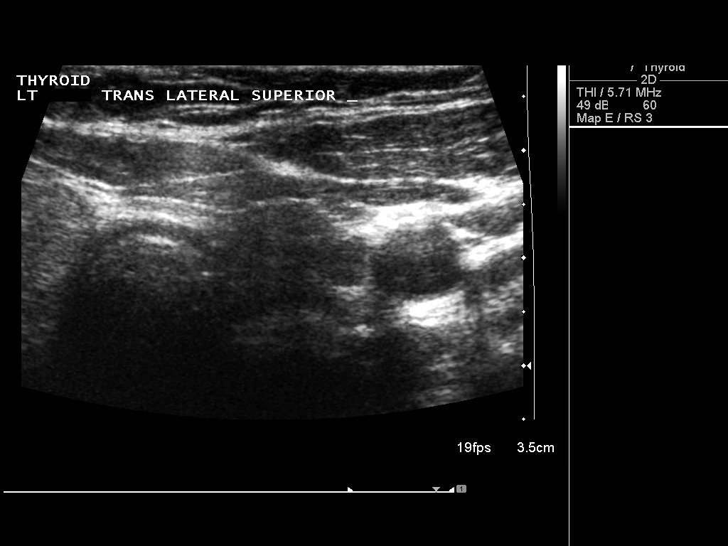
[im 45/68]
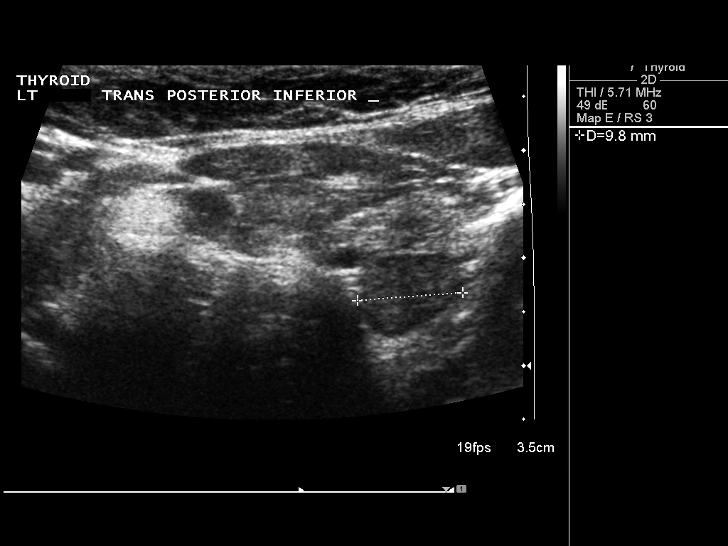
[im 51/68]
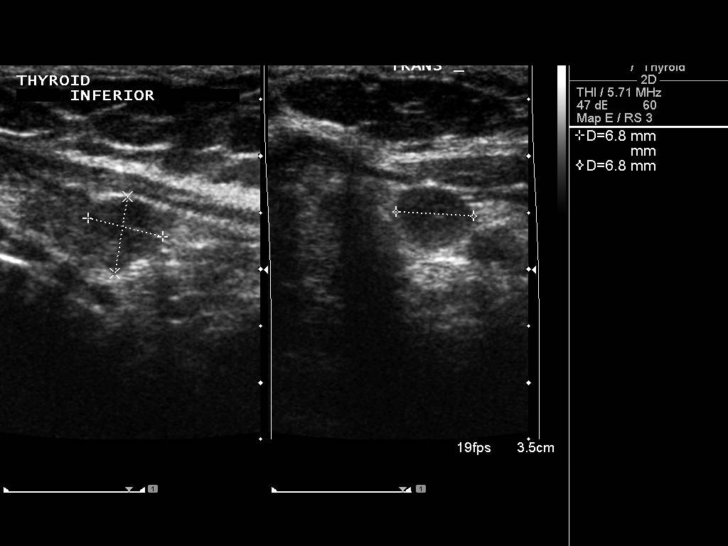
[im 56/68]
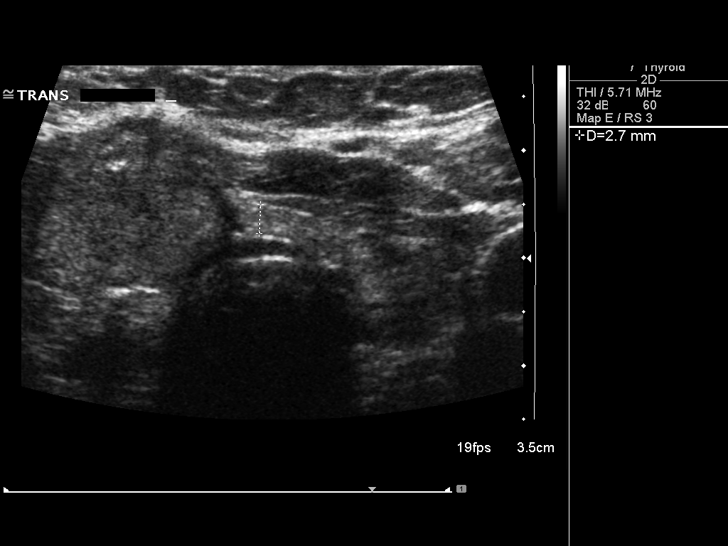
[im 62/68]
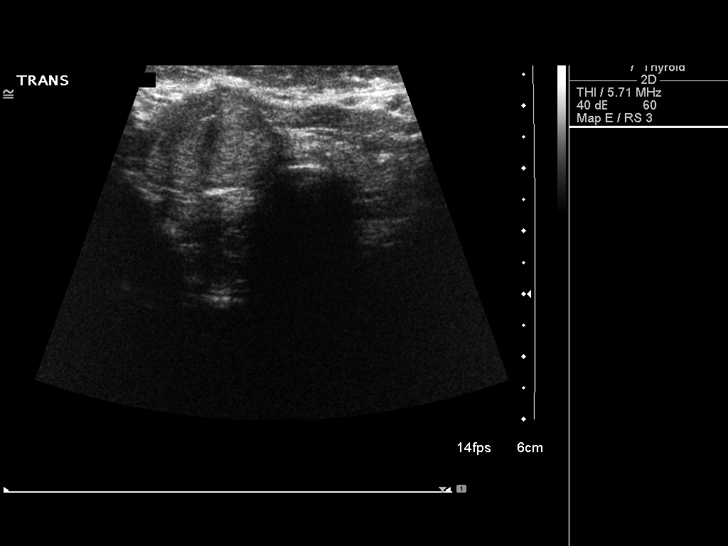
[im 68/68]
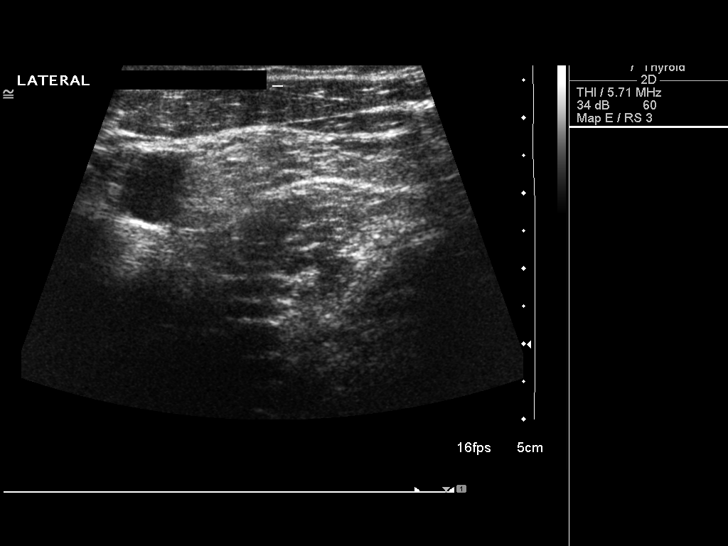

[14 of 25 positions shown; findings below may reference images not displayed]

FINDINGS: Right thyroid lobe:  3.6 x 2.2 x 2.1 cm.
Left thyroid lobe:  4.0 x 1.3 x 1.2 cm.
Isthmus:  3 mm in thickness.

Focal nodules:  The echogenicity of the thyroid parenchyma is
inhomogeneous.  Multiple nodules are present bilaterally.  The
largest solid nodule is in the right isthmus measuring 2.6 x 1.5 x
1.9 cm with calcification of the periphery.  A nodule in the lower
pole on the right measures 1.7 x 1.5 x 1.2 cm with some cystic
component.  A nodule also in the lower pole on the right more
medially measures 1.5 x 1.5 x 1.3 cm.  A nodule in the upper pole
on the right measures 1.3 x 0.4 x 1.0 cm.  The largest solid nodule
on the left is in the lower pole measuring 1.2 x 0.8 x 1.0 cm.
Additional nodules are present of no more than 11 mm in diameter.

Lymphadenopathy:  None visualized.
IMPRESSION: Multiple thyroid nodules most consistent with multinodular goiter.
Biopsy of the dominant nodules could be performed if warranted
clinically.

## 2013-12-31 IMAGING — CR DG CHEST 2V
2 series · 2 of 2 positions shown · non-contrast
Comparison: None.

CLINICAL DATA: Hypertension, preoperative evaluation for thyroid
surgery

CHEST - 2 VIEW

[view not recorded (1 of 2)]
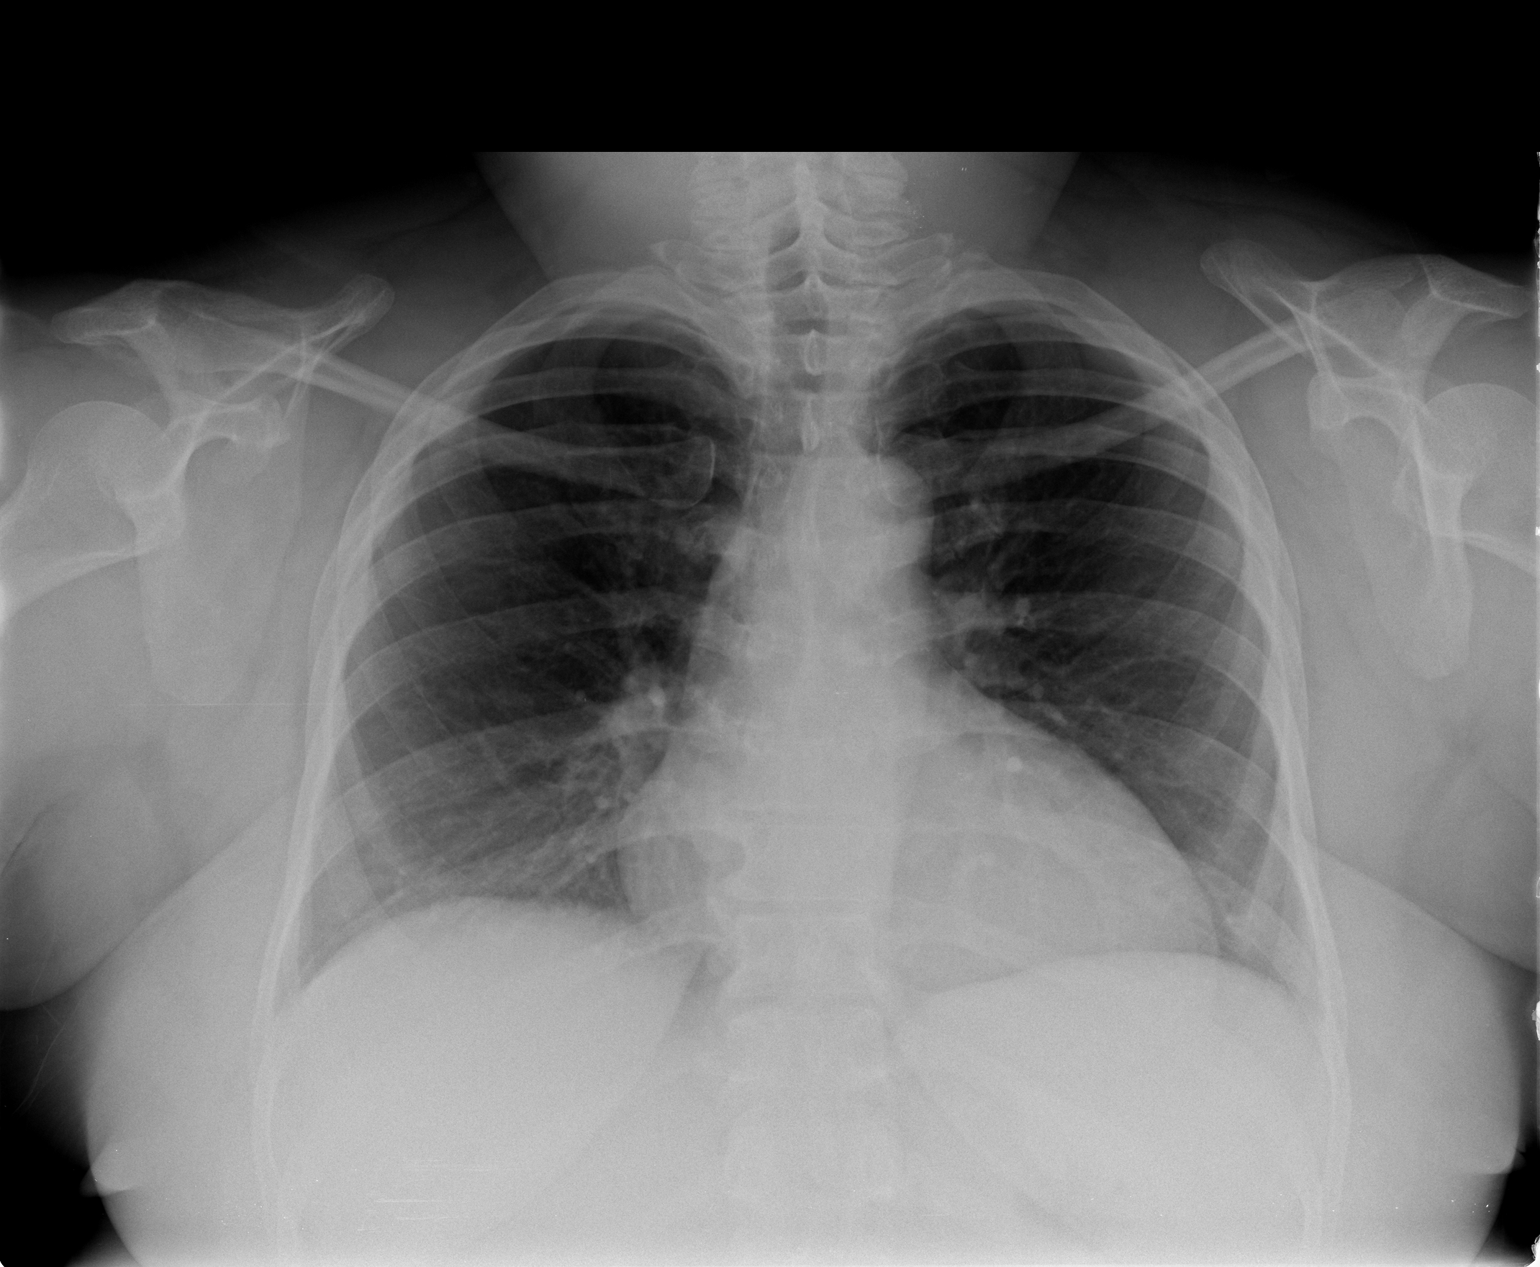

[view not recorded (2 of 2)]
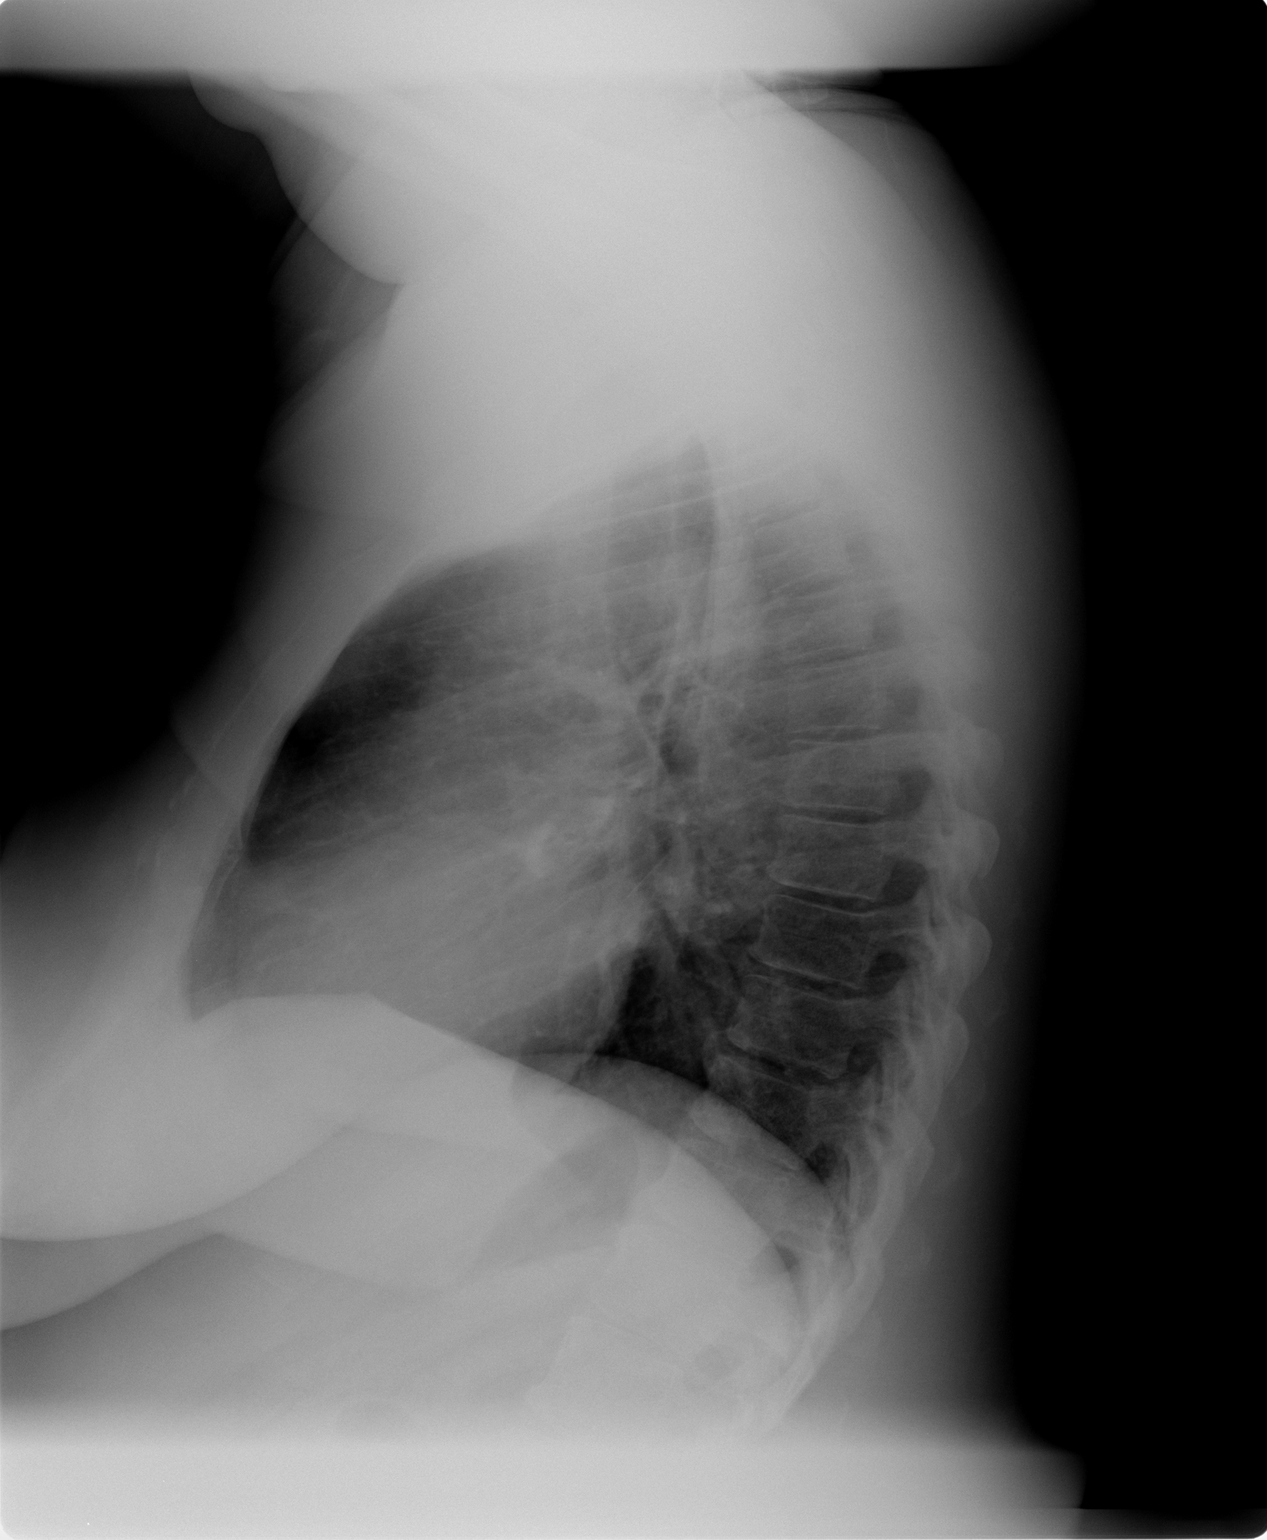

[2 of 2 positions shown; findings below may reference images not displayed]

FINDINGS: The heart and pulmonary vascularity are within normal
limits.  The lungs are clear.  No acute bony abnormality is seen.
IMPRESSION: No acute abnormality noted.

## 2014-07-02 ENCOUNTER — Other Ambulatory Visit: Payer: Self-pay | Admitting: Endocrinology

## 2014-07-02 DIAGNOSIS — C73 Malignant neoplasm of thyroid gland: Secondary | ICD-10-CM

## 2014-07-21 ENCOUNTER — Encounter (HOSPITAL_COMMUNITY)
Admission: RE | Admit: 2014-07-21 | Discharge: 2014-07-21 | Disposition: A | Discharge status: Home / Self Care | Payer: BC Managed Care – PPO | Source: Ambulatory Visit | Attending: Endocrinology | Admitting: Endocrinology

## 2014-07-21 DIAGNOSIS — C73 Malignant neoplasm of thyroid gland: Secondary | ICD-10-CM | POA: Insufficient documentation

## 2014-07-21 MED ORDER — THYROTROPIN ALFA 1.1 MG IM SOLR
0.9000 mg | INTRAMUSCULAR | Status: AC
  Administered 2014-07-21: 0.9 mg via INTRAMUSCULAR
  Filled 2014-07-21: qty 0.9

## 2014-07-22 ENCOUNTER — Encounter (HOSPITAL_COMMUNITY)
Admission: RE | Admit: 2014-07-22 | Discharge: 2014-07-22 | Disposition: A | Discharge status: Home / Self Care | Payer: BC Managed Care – PPO | Source: Ambulatory Visit | Attending: Endocrinology | Admitting: Endocrinology

## 2014-07-22 DIAGNOSIS — C73 Malignant neoplasm of thyroid gland: Secondary | ICD-10-CM | POA: Diagnosis not present

## 2014-07-22 MED ORDER — THYROTROPIN ALFA 1.1 MG IM SOLR
0.9000 mg | INTRAMUSCULAR | Status: AC
  Administered 2014-07-22: 0.9 mg via INTRAMUSCULAR
  Filled 2014-07-22: qty 0.9

## 2014-07-23 ENCOUNTER — Encounter (HOSPITAL_COMMUNITY)
Admission: RE | Admit: 2014-07-23 | Discharge: 2014-07-23 | Disposition: A | Discharge status: Home / Self Care | Payer: BC Managed Care – PPO | Source: Ambulatory Visit | Attending: Endocrinology | Admitting: Endocrinology

## 2014-07-25 ENCOUNTER — Encounter (HOSPITAL_COMMUNITY)
Admission: RE | Admit: 2014-07-25 | Discharge: 2014-07-25 | Disposition: A | Discharge status: Home / Self Care | Payer: BC Managed Care – PPO | Source: Ambulatory Visit | Attending: Endocrinology | Admitting: Endocrinology

## 2014-07-25 DIAGNOSIS — C73 Malignant neoplasm of thyroid gland: Secondary | ICD-10-CM | POA: Diagnosis not present

## 2014-07-25 MED ORDER — SODIUM IODIDE I 131 CAPSULE
4.0000 | Freq: Once | INTRAVENOUS | Status: AC | PRN
  Administered 2014-07-25: 4 via ORAL

## 2014-09-17 ENCOUNTER — Other Ambulatory Visit: Payer: Self-pay | Admitting: Obstetrics and Gynecology

## 2014-09-18 LAB — CYTOLOGY - PAP

## 2014-12-02 NOTE — Telephone Encounter (Signed)
Old message °

## 2015-10-02 NOTE — Other (Unsigned)
Patient Acct Nbr: [id number]   Primary AUTH/CERT:   Primary Insurance Company Name: [employer] [place]  Primary [facility] Plan [facility]: [facility]  Primary Insurance Group Number: [id number]  Primary Insurance Plan Type: Health  Primary Insurance Policy Number: [id number]

## 2016-02-15 ENCOUNTER — Other Ambulatory Visit: Payer: Self-pay | Admitting: Obstetrics and Gynecology

## 2016-02-16 LAB — CYTOLOGY - PAP

## 2018-04-20 ENCOUNTER — Inpatient Hospital Stay
Admit: 2018-04-20 | Discharge: 2018-04-20 | Disposition: A | Discharge status: Home / Self Care | Attending: Emergency Medicine

## 2018-04-20 DIAGNOSIS — R42 Dizziness and giddiness: Secondary | ICD-10-CM

## 2018-04-20 LAB — CBC WITH AUTO DIFFERENTIAL
Absolute Baso #: 0.1 10*3/uL (ref 0.0–0.2)
Absolute Eos #: 0.1 10*3/uL (ref 0.0–0.5)
Absolute Lymph #: 2.4 10*3/uL (ref 1.0–4.3)
Absolute Mono #: 0.8 10*3/uL (ref 0.0–0.8)
Absolute Neut #: 5.2 10*3/uL (ref 1.8–7.0)
Basophils: 1.1 % (ref 0.0–2.0)
Eosinophils: 1.6 % (ref 1.0–6.0)
Granulocytes %: 60.1 % (ref 40.0–80.0)
Hematocrit: 44.5 % (ref 35.0–47.0)
Hemoglobin: 15.3 g/dL (ref 11.7–16.0)
Lymphocyte %: 28.2 % (ref 20.0–40.0)
MCH: 33.9 pg (ref 26.0–34.0)
MCHC: 34.4 % (ref 32.0–36.0)
MCV: 98.5 fL — ABNORMAL HIGH (ref 79.0–98.0)
MPV: 8.1 fL (ref 7.4–10.4)
Monocytes: 9 % (ref 2.0–10.0)
Platelets: 218 10*3/uL (ref 140–440)
RBC: 4.52 10*6/uL (ref 3.80–5.20)
RDW: 12.9 % (ref 11.5–14.5)
WBC: 8.6 10*3/uL (ref 3.6–10.7)

## 2018-04-20 LAB — COMPREHENSIVE METABOLIC PANEL
ALT: 27 U/L (ref 13–69)
AST: 32 U/L (ref 15–46)
Albumin,Serum: 4.3 g/dL (ref 3.5–5.0)
Alkaline Phosphatase: 53 U/L (ref 38–126)
Anion Gap: 11 NA
BUN: 15 mg/dL (ref 7–20)
CO2: 24 mmol/L (ref 22–30)
Calcium: 9.8 mg/dL (ref 8.4–10.4)
Chloride: 104 mmol/L (ref 98–107)
Creatinine: 0.7 mg/dL (ref 0.52–1.25)
EGFR IF NonAfrican American: >60 mL/min (ref >60)
Glucose: 100 mg/dL (ref 70–100)
Potassium: 4.2 mmol/L (ref 3.5–5.1)
Sodium: 139 mmol/L (ref 135–145)
Total Bilirubin: 0.7 mg/dL (ref 0.2–1.3)
Total Protein: 7.3 g/dL (ref 6.3–8.2)
eGFR African American: >60 mL/min (ref >60)

## 2018-04-20 LAB — TROPONIN: Troponin I: <0.012 ng/mL (ref 0.000–0.034)

## 2018-04-20 MED ORDER — DIPHENHYDRAMINE HCL 50 MG/ML IJ SOLN
50 MG/ML | Freq: Once | INTRAMUSCULAR | Status: DC
Start: 2018-04-20 — End: 2018-04-20

## 2018-04-20 MED ORDER — KETOROLAC TROMETHAMINE 30 MG/ML IJ SOLN
30 MG/ML | Freq: Once | INTRAMUSCULAR | Status: DC
Start: 2018-04-20 — End: 2018-04-20

## 2018-04-20 MED ORDER — NORMAL SALINE FLUSH 0.9 % IV SOLN
0.9 % | Freq: Three times a day (TID) | INTRAVENOUS | Status: DC
Start: 2018-04-20 — End: 2018-04-20

## 2018-04-20 MED ORDER — METOCLOPRAMIDE HCL 5 MG/ML IJ SOLN
5 MG/ML | Freq: Once | INTRAMUSCULAR | Status: DC
Start: 2018-04-20 — End: 2018-04-20

## 2018-04-20 MED ORDER — SODIUM CHLORIDE 0.9 % IV BOLUS
0.9 % | Freq: Once | INTRAVENOUS | Status: DC
Start: 2018-04-20 — End: 2018-04-20

## 2018-04-20 MED FILL — KETOROLAC TROMETHAMINE 30 MG/ML IJ SOLN: 30 MG/ML | INTRAMUSCULAR | Qty: 1

## 2018-04-20 MED FILL — DIPHENHYDRAMINE HCL 50 MG/ML IJ SOLN: 50 MG/ML | INTRAMUSCULAR | Qty: 1

## 2018-04-20 MED FILL — METOCLOPRAMIDE HCL 5 MG/ML IJ SOLN: 5 MG/ML | INTRAMUSCULAR | Qty: 2

## 2018-04-20 NOTE — ED Notes (Signed)
Patient back from CT at this time     Joellyn Haff, RN  04/20/18 1136

## 2018-04-20 NOTE — ED Triage Notes (Signed)
Pt complains of vertigo that started yesterday and it has been on and off.

## 2018-04-20 NOTE — ED Provider Notes (Signed)
[facility]  eMERGENCY dEPARTMENT eNCOUnter      Pt Name: Kimberly Beavereresa J Navia  MRN: [id number]  Birthdate 05-Dec-1965  Date of evaluation: 04/20/2018  Provider: Jimmie MollyMadalina Cristin Szatkowski, PA-C     CHIEF COMPLAINT       Chief Complaint   Patient presents with   ??? Dizziness       I evaluated this patient in conjunction with Dr. Glorianne ManchesterKazakin the ED attending who is in agreement with the assessment and plan.     HISTORY OF PRESENT ILLNESS   (Location/Symptom, Timing/Onset,Context/Setting, Quality, Duration, Modifying Factors, Severity) Note limiting factors.   HPI    Kimberly Shah is a 52 y.o. female who presents to the emergency department with dizziness.  Patient states that she has no past medical history other than migraines and takes no daily medications.  Yesterday while she was at work she felt a "internal dizziness and pressure in her head".  States this lasted a few minutes and then resolved.  Patient again states today she has had 3 episodes.  Patient states that the episode lasted about 10-15 minutes 4 she had to lay down and close her eyes for it to resolve.  Patient states that she had some "queasy" feeling without emesis.  Denies absolutely any headache associated with this.  Denies any blurry vision, double vision, neck pain, neck stiffness, numbness, tingling, chest pains, shortness of breath, abdominal pain.  Denies any injuries, traumas, falls, unsteady gait, etc.  Patient is a daily cigarette smoker.    Nursing Notes were reviewed.    REVIEW OF SYSTEMS    (2+ forlevel 4; 10+ for level 5)     Review of Systems at least 10 systems reviewed and otherwise acutely negative except as stated in HOPI.     PAST MEDICAL HISTORY   History reviewed. No pertinent past medical history.    SURGICALHISTORY     History reviewed. No pertinent surgical history.    CURRENT MEDICATIONS       There are no discharge medications for this patient.      ALLERGIES     Penicillins    FAMILY HISTORY     History reviewed. No pertinent family  history.       SOCIAL HISTORY       Social History     Socioeconomic History   ??? Marital status: Married     Spouse name: None   ??? Number of children: None   ??? Years of education: None   ??? Highest education level: None   Occupational History   ??? None   Social Needs   ??? Financial resource strain: None   ??? Food insecurity:     Worry: None     Inability: None   ??? Transportation needs:     Medical: None     Non-medical: None   Tobacco Use   ??? Smoking status: Current Every Day Smoker   ??? Smokeless tobacco: Never Used   Substance and Sexual Activity   ??? Alcohol use: Yes   ??? Drug use: None   ??? Sexual activity: None   Lifestyle   ??? Physical activity:     Days per week: None     Minutes per session: None   ??? Stress: None   Relationships   ??? Social connections:     Talks on phone: None     Gets together: None     Attends religious service: None     Active member of club  or organization: None     Attends meetings of clubs or organizations: None     Relationship status: None   ??? Intimate partner violence:     Fear of current or ex partner: None     Emotionally abused: None     Physically abused: None     Forced sexual activity: None   Other Topics Concern   ??? None   Social History Narrative   ??? None       SCREENINGS           PHYSICAL EXAM    (5+ for level 4, 8+ for level 5)     ED Triage Vitals [04/20/18 1034]   BP Temp Temp Source Pulse Resp SpO2 Height Weight   (!) 138/91 98.3 ??F (36.8 ??C) Temporal 75 18 94 % -- --       Physical Exam  GENERAL:  The patient is lying comfortably in bed in no apparent distress, appears nourished and normally developed. Vital signs as documented.     EYES: Pupils are equal, round and reactive to light and accommodation.  EOMs intact with no nystagmus.  Head exam is unremarkable. No scleral icterus or orbital trauma noted.     HEENT: Full range of motion of neck with no limitations.  Neck is supple.  No cervical midline tenderness.  No meningeal signs.  Uvula midline with no oropharyngeal  swelling or tonsillar exudates.  Mucous membranes moist. Nares patent without copious rhinorrhea.  No enlarged lymphadenopathy.      LUNGS:  Lungs are clear to auscultation, without any respiratory distress.  No wheezing, rales or rhonchi.  Nonlabored breathing.  94% on room air.    CARDIAC:   Rhythm is regular, rate 75. No dysrythmias or murmurs.     ABDOMEN: Bowel sounds present.  Soft, nondistended, Nontender with no obvious masses, and no peritoneal signs.      EXTREMITIES:   Non edematous, with no obvious deformities.  Distal pulses equal and intact with good capillary refill.  Full range of motion of all 4 extremities with no limitations.    SKIN:   Good color, with no significant rashes.  No pallor.    NEURO:  A and O ??3.  No obvious neurological deficits, normal sensation and strength bilaterally.  patient able to ambulate.    DIAGNOSTIC RESULTS     EKG (Per Emergency Physician)       RADIOLOGY (Per Emergency Physician):       Interpretation per the Radiologist below, if available at the time of this note:  Ct Head Wo Contrast    Result Date: 04/20/2018  Patient Name:  VICTORIAN, GUNN MRN:  [id number] FIN:  [id number] ---CT--- Exam Date/Time        04/20/2018 11:34:23 EDT                              Exam                  CT Head or Brain w/o Contrast                        Ordering Physician    Larence Penning, [facility]                            Accession Number      [id number]  CPT4 Codes 14782 () Reason For Exam dizzyness, Report CLINICAL INFORMATION:  Dizziness and nausea. Unenhanced CT images of the head from skull base to vertex were obtained. Coronal and sagittal reconstructed CT images of the head were also obtained.  There were no old examinations available for comparison at the time of dictation. The ventricles and sulci are within normal limits for the patient's age.  There is no evidence of hemorrhage, mass or large acute infarct.  There is no mass effect  or significant shift of the midline structures.  There are no extraaxial or posterior fossa masses or fluid collections.  No significant abnormalities are noted in the region of the sella turcica.   No significant abnormalities of the visualized portions of the paranasal sinuses or orbits are noted. The mastoid air cells are clear. IMPRESSION: 1. No definite evidence of an acute intracranial process. Report Dictated on Workstation: [id number] ---** Final ---** Dictating Physician: Stann Mainland Signed Date and Time: 04/20/2018 12:11 pm Signed by: Stann Mainland Transcribed Date and Time: 04/20/2018 12:12      LABS:  Labs Reviewed   CBC WITH AUTO DIFFERENTIAL - Abnormal; Notable for the following components:       Result Value    MCV 98.5 (*)     All other components within normal limits    Narrative:     Test Performed by [facility], [address],  [place], [place] [address]   TROPONIN    Narrative:     Test Performed by [facility], [address]  [place], [place] [address]   COMPREHENSIVE METABOLIC PANEL    Narrative:     Test Performed by [facility], [address],  [place], [place] [address]       All other labs were within normal range or not returned as of this dictation.    EMERGENCY DEPARTMENT COURSE and DIFFERENTIAL DIAGNOSIS/MDM:   Vitals:    Vitals:    04/20/18 1034 04/20/18 1130   BP: (!) 138/91    Pulse: 75    Resp: 18 18   Temp: 98.3 ??F (36.8 ??C)    TempSrc: Temporal    SpO2: 94% 99%       Medications   sodium chloride flush 0.9 % injection 3 mL (has no administration in time range)       MDM.  Patient is not septic or toxic appearing.  Vital stable.  She is afebrile.  Upon my initial evaluation patient is afebrile without any rashes, altered mental status, neck pain, neck stiffness, headache, etc.  Therefore I do have low suspicion for meningitis.  She has no previous past medical history and has not had any falls, trauma, etc. I have low suspicion for subarachnoid  hemorrhage.  Patient is completely neurologically intact with no obvious neurological deficit.  I am unsure exactly what is causing the patient's symptoms.  This being said we will obtain a head CT, basic blood work and electrocardiogram to evaluate for any electrolyte abnormalities, etc.    Patient's BMP is completely unremarkable.  No renal insufficiency or electrolyte abnormality.  Screening troponin is negative.  Hepatic function panel within normal limits.  There is no leukocytosis or anemia.  CT head shows no definitive evidence of acute intracranial process.    Upon reevaluation patient has not developed any new or worsening signs or symptoms.  Patient continues to state that she feels "hot" but is unable to give any specific symptoms.  Patient  states that she is ready for discharge and she will follow up with her primary care if needed if anything worsens.  Patient is neurologically intact.  She is to return to Emergency Department if any signs or symptoms worsen.  She understands and agrees with the discharge decision.  She has a steady gait.  Patient is discharged home in stable condition.      CONSULTS:  None    PROCEDURES:  Unless otherwise noted below, none     Procedures    FINAL IMPRESSION      1. Dizziness          DISPOSITION/PLAN   DISPOSITION Decision To Discharge 04/20/2018 12:31:25 PM      PATIENT REFERRED TO:  Wendie Chess, MD  [address], [place]  [place] [address]  [phone]    Schedule an appointment as soon as possible for a visit in 1 day        DISCHARGE MEDICATIONS:  There are no discharge medications for this patient.         (Pleasenote:  Portions of this note were completed with a voice recognition program.  Efforts were made toedit the dictations but occasionally words and phrases are mis-transcribed.)    Form [id number]    Jimmie Molly, PA-C (electronically signed)  Emergency Medicine Provider             Jimmie Molly, PA-C  04/20/18 1245

## 2018-04-20 NOTE — Discharge Instructions (Addendum)
Follow up with her primary care provider.  Return if any signs or symptoms worsen.

## 2018-04-20 NOTE — ED Notes (Signed)
TO CT LAB SENT     Al PimpleLori Chloeann Alfred, RN  04/20/18 1131

## 2018-04-20 NOTE — ED Notes (Signed)
Patient provided with discharge instructions at this time.  Patient states if "we" would have tested for vertigo here, patient would not have to follow up with PCP.       Joellyn Haff, RN  04/20/18 [phone]

## 2018-04-20 NOTE — ED Provider Notes (Signed)
Emergency Department Encounter  [facility] ED    Patient: Kimberly Shah  MRN: [id number]  DOB: [birth year]  Date of Evaluation: 04/20/2018  ED Supervising Physician: Kimberly Flood, MD    I independently examined and evaluated Kimberly Shah.    In brief, Kimberly Shah is a 52 y.o. female that presents to the emergency department with sensation of feeling "off."  Patient states she had episodes of dizziness that occurred yesterday and today at work.  She did pull over while driving.    Focused exam:   BP (!) 138/91    Pulse 75    Temp 98.3 ??F (36.8 ??C) (Temporal)    Resp 18    SpO2 99%   Physical Exam   Constitutional: oriented to person, place, and time. No distress.   HENT: MMM  Head: Normocephalic and atraumatic.   Eyes: Right eye exhibits no discharge. Left eye exhibits no discharge. No scleral icterus.   Neck: No tracheal deviation present.   Cardiovascular: Normal rate and regular rhythm.   Pulmonary/Chest: Effort normal. No respiratory distress.   Abdominal: no distension, no mass  Musculoskeletal: Normal range of motion. exhibits no deformity.   Neurological: alert and oriented to person, place, and time.  No truncal ataxia, steady gait, cranial nerves II through XII are intact, sensation intact to light touch  Skin: Skin is warm and dry. Capillary refill takes less than 2 seconds. not diaphoretic.   Psychiatric: normal mood and affect. behavior is normal. Judgment and thought content normal.   Nursing note and vitals reviewed.      Brief ED course/MDM: 52 year old female presents with concern for "feeling off."  Patient has difficulty describing the dizziness.  She does not appear to have any neurologic symptoms at this time.  Her history is not strongly suggestive of acute vestibular syndrome or posterior stroke.  She does not have any visual changes to warrant further ophthalmologic studies.  She denies any clear triggers.  Denies any headaches.  At this time significant diagnostic uncertainty with  discussed with patient and husband.  Recommend close outpatient follow-up with return precautions.  Doubt acute cardiac or pulmonary process.    Ct Head Wo Contrast    Result Date: 04/20/2018  Patient Name:  TKAI, LARGE MRN:  [id number] FIN:  [id number] ---CT--- Exam Date/Time        04/20/2018 11:34:23 EDT                              Exam                  CT Head or Brain w/o Contrast                        Ordering Physician    Larence Penning [facility]                            Accession Number      [id number]                                        CPT4 Codes 96295 () Reason For Exam dizzyness, Report CLINICAL INFORMATION:  Dizziness and nausea. Unenhanced CT images of the head from skull base to vertex were obtained. Coronal and sagittal reconstructed  CT images of the head were also obtained.  There were no old examinations available for comparison at the time of dictation. The ventricles and sulci are within normal limits for the patient's age.  There is no evidence of hemorrhage, mass or large acute infarct.  There is no mass effect or significant shift of the midline structures.  There are no extraaxial or posterior fossa masses or fluid collections.  No significant abnormalities are noted in the region of the sella turcica.   No significant abnormalities of the visualized portions of the paranasal sinuses or orbits are noted. The mastoid air cells are clear. IMPRESSION: 1. No definite evidence of an acute intracranial process. Report Dictated on Workstation: [id number] ---** Final ---** Dictating Physician: Stann MainlandORSINO, DO, ANTHONY Signed Date and Time: 04/20/2018 12:11 pm Signed by: Stann MainlandORSINO, DO, ANTHONY Transcribed Date and Time: 04/20/2018 12:12      ECG:  Electrocardiogram reviewed by Emergency Department physician and is notable for a normal sinus rhythm 68, normal axis, normal intervals and no ST segment or T-wave abnormalities.  Impression: Sinus rhythm with no STEMI      All diagnostic, treatment,  and disposition decisions were made by myself in conjunction with the APP/Resident. For all further details of the patient's emergency department visit, please see their documentation.    (Please note that portions of this note may have been completed with a voice recognition program. Efforts were made to edit the dictations but occasionally words are mis-transcribed.)    Kimberly FloodAnatoly Malasia Torain, MD  [employer]          Kimberly FloodAnatoly Emireth Cockerham, MD  04/20/18 1232

## 2018-04-20 NOTE — ED Notes (Signed)
Patient refusing medications at this time.  Dr. Glorianne Manchester notified.     Joellyn Haff, RN  04/20/18 1139

## 2018-04-20 NOTE — ED Notes (Signed)
States denies pain just alittle dizzy states"has sinus issues"     Al Pimple, RN  04/20/18 1039

## 2018-04-20 NOTE — Other (Unsigned)
Patient Acct Nbr: [id number]   Primary AUTH/CERT:   Primary Insurance Company Name: [employer] [place]  Primary Insurance Plan name: [facility] SuperMed  Primary Insurance Group Number: [id number]  Primary Insurance Plan Type: Health  Primary Insurance Policy Number: [id number]

## 2018-05-25 ENCOUNTER — Other Ambulatory Visit: Payer: Self-pay | Admitting: Endocrinology

## 2018-05-25 DIAGNOSIS — C73 Malignant neoplasm of thyroid gland: Secondary | ICD-10-CM

## 2018-06-14 ENCOUNTER — Ambulatory Visit
Admission: RE | Admit: 2018-06-14 | Discharge: 2018-06-14 | Disposition: A | Discharge status: Home / Self Care | Payer: Self-pay | Source: Ambulatory Visit | Attending: Endocrinology | Admitting: Endocrinology

## 2018-06-14 DIAGNOSIS — C73 Malignant neoplasm of thyroid gland: Secondary | ICD-10-CM

## 2020-12-09 ENCOUNTER — Inpatient Hospital Stay: Admit: 2020-12-09 | Primary: Family Medicine

## 2020-12-09 NOTE — Care Coordination-Inpatient (Deleted)
[facility]    Case Management/Care Coordination Note          Date of Contact:  12/11/20   Start Time: 10:30 End Time: 11:00  Duration: 30    Type of Contact:  Telephone      Patient's Identified Case Management/Care Coordination Need(s) addressed (select all that apply):            Legal               Summary of Contact/Need at This Time: Therapist outreached Pt's referral source, [place] probation [employer]/textile technologist. Therapist gathered collateral information regarding Pt's referral and information surrounding recent legal events. Therapist discussed treatment recommendations.         Case Management Plan/Intervention: (select all that apply)         Other: treatment recommendations             Description of Case Management Plan/Intervention: Therapist will compose letter denoting treatment recommendations and fax to referral source.               Note Any Anticipated Barriers:  None        Patient's response to Plan/Intervention (or, if applicable, outcome of Case Management collateral contact/referral): Therapist contacted Pt and left VM.            Progress Towards Case Management Goal(s):  _0  None _1  Minimal _2  Moderate _3  Significant     _4  Case Management Goal(s) Met    Additional Comments: none       Electronically signed by Tonia Brooms, LPCC on 12/11/2020 at 10:37 AM

## 2020-12-09 NOTE — Progress Notes (Signed)
MENT (Page 1 of 12)OUTPATIENT BEHAVIORAL HEALTH ASSESSMENT    Date of Assessment:  12/09/20    Start Time: 16:20     End Time: 17:15         Does patient have a Court Appointed Guardian?   '[]'$  Yes     '[x]'$  No   Does patient have a Durable Power of Attorney?   '[]'$  Yes     '[x]'$  No   Does the patient have an Advanced Directive?       If Yes, copy received?   '[]'$  Yes     '[]'$  No   '[]'$  Yes     '[x]'$  No       CLINICAL SCREENING TOOLS:    Screening Tool Score Comment (required for each screening tool)   PHQ-9 0 Denied or none reported   GAD-7 0 Denied or none reported    AUDIT 5 Endorsed items indicate symptoms of alcohol abuse   DAST-10 0 Denied or none reported    Life Events Checklist NEG Denied or none reported   PCL-5 0 Denied or none reported        LANGUAGE PREFERENCE AND LANGUAGE(S) SPOKEN: english    PRESENTING PROBLEM(S) (as reported by patient, include referral source): Pt reports being arrested and charged with a DUI on 07/27/2020. After Pt was sentenced on 10/23/20 she was referred to have an assessment completed.  Patient referred to [facility] for outpatient behavioral health services by:  probation officer.           HISTORY OF PRESENTING PROBLEM(S)  (Onset, duration, precipitating factors, why person is here now, contributing stressors): Pt reports that this was her first DUI. Pt reports "I had to much to drink at the bar and went to drive home and that wasn't the smartest thing". Pt reports last drinking alcohol in early November 2021. PT reports that she would drink infrequently, "here an there on the weekends or when the weather is nice". Pt reports when she does drink she consumes 3-4 light beers at a time. PT reports her alcohol use was heaviest during her 43s when she was with friends and going out often. Pt reports recently her alcohol use increased during the beginning of 2020 correlating with the pandemic and being home more. Pt denies other substance use in her lifetime.     CURRENT ENVIRONMENT/LIVING  SITUATION (housing stability, does patient feel safe at home, anyone live with patient, etc): Pt reports living with husband and 2 children.  Pt reports "comical" home environment. Pt denies any current struggles and reports good environment.      CURRENT FAMILY CIRCUMSTANCES (include family involvement, level of family support for treatment, bereavement concerns): Pt reports having "good relationships" with children and husband. Pt reports having "funny" relationships with brother and sister. Pt reports that her parents are both deceased.     CHILDHOOD/ADOLESCENT HISTORY (note any significant developmental issues, history of abuse/neglect, history of emotional or behavioral concerns, what was it like growing up in your family, etc.): Pt reports that "we moved around" a lot because of her father's occupation. Pt reports that she was in 40 different schools. Pt reports that she lived in a "normal" household. Pt reports that her parents divorced when she was in junior high school and she resided with mother. PT reports that her mother and father both got remarried and she had "good" relationships with both step parents.      FAMILY OF ORIGIN HISTORY:     Parents:  '[]'$   Married (until death of one parent) '[]'$  Never Married (to each other)  '[x]'$  Divorced    If parents never married each other, which parent was primary caregiver?  '[x]'$  Mother     '[]'$   Father    How does patient describe relationship with parents:  Pt reports having "good" relationships with both biological parents. Pt reports being closests with mother.     Father: '[]'$  Alive '[x]'$  Deceased: COPD       History of problems with: '[]'$  Mental Health   '[]'$  Alcohol   '[]'$  Drugs        If yes, explain:     Mother: '[]'$  Alive '[x]'$  Deceased: COPD and heart disease        History of problems with: '[]'$  Mental Health   '[]'$  Alcohol   '[]'$  Drugs        If yes, explain:     Siblings:  # Brothers: 42 y/o    # Sisters:  53 y/o       '[x]'$  Alive '[]'$  Deceased (Explain if deceased)       History  of problems with: '[]'$  Mental Health   '[]'$  Alcohol   '[]'$  Drugs        If yes, explain:     How were drugs/alcohol used in your family growing up?:  Pt reports "I am sure they had a beer here and there".     Any other relatives who have mental health problems? '[]'$  Yes '[x]'$  No        If yes, explain:     Any other relatives who have alcohol/drug problems? '[]'$  Yes '[x]'$  No        If yes, explain:     SPOUSE/SIGNIFICANT OTHER RELATIONSHIP HISTORY:    '[]'$  Single, never married '[]'$  Living together '[]'$  Engaged '[x]'$  Married # years:27   '[]'$  Separated '[]'$  Divorced # times: '[]'$  Widowed    Describe history of significant partner relationships (describe history of marriages/other significant romantic relationships): Pt reports having a relationship with someone from highschool for 8 years and reports that "it just didn't work out".     Children & Ages (if applicable, include custody status/concerns) : 2 children, 73 y/o son and 34 y/o daughter     Has any spouse/significant other struggled with mental health, alcohol or drug problems? No  If yes, explain (including impact on relationship):   Denied     Does patient have any history (childhood or as an adult) of any of the following:  Abuse/Domestic Violence   No  If Yes, explain:   Neglect   No  If Yes, explain:   Exploitation   No  If Yes, explain:     SEXUAL HISTORY:    Gender Identity: '[x]'$  Cisgender '[]'$  Transgender '[]'$  Unsure  '[]'$  Other:    Sexual Preference: '[x]'$  Heterosexual '[]'$  Bisexual '[]'$  Gay/Lesbian '[]'$  Asexual '[]'$  Other:     Have you ever traded sex for anything (i.e. food, money, drugs)? '[]'$  Yes '[x]'$  No; Comments: N/A  Have you ever been coerced or forced to engage in any sexual activity? '[]'$  Yes '[x]'$  No Comments: N/A    CULTURAL AND ETHNIC INFORMATION: (note patient's cultural beliefs, values and traditions and identify any impact on treatment) Pt denied cultural or ethnic impact on treatment.     RELIGION OR SPIRITUAL ORIENTATION: (note if patient identifies any belief in higher power,  religious belief, or not. Identify any spiritual/religious beliefs about suicide) Christianity, Pt reports attending church infrequently. Pt reports  having a belief in a higher power.     EDUCATIONAL HISTORY:  Highest level of education attained:  Pt reports going to "some college". Pt reports stopping school because she was working.   Education background (type, setting):  Building surveyor, classroom  Academic performance and preferred areas of study):  Pt reports doing well in school   Attitude toward academic achievement: Pt reports enjoying school  Interest in future education/training: Denied     VOCATION/EMPLOYMENT HISTORY (patient currently employed? If yes, where, how long, FT/PT? If no, is patient seeking work, disabled, comment if unemployment related to behavioral health needs at this time): Employed full time at a bank.     MILITARY SERVICE (if yes, branch, duration, did they see combat, discharge status): Denied     FINANCIAL ISSUES (Note any financial concerns): Denied     SOCIAL/SUPPORT SYSTEM (describe usual social, peer-group, environmental settings - note if any recent changes): PT reports having a supportive group of friends outside of her family and has maintained friendships with them for many years. Pt reports that her main support system is her family, "we are always there for each other".     LEISURE AND RECREATIONAL INTERESTS (hobbies, interests, usual social activities - note if any recent changes): Reading, cooking, spending time with family.     ABILITY TO SELF-CARE: Yes     COMMUNITY RESOURCES ACCESSED BY PATIENT (Identify any community resources accessed by patient): Denied     LEGAL HISTORY:  '[x]'$  Yes '[]'$  No Ever been arrested? If Yes, charges: DUI   '[]'$  Yes '[x]'$  No Ever been in jail or prison? If Yes, Where and Time spent:  '[x]'$  Yes '[]'$  No Currently on Parole/Probation? If Yes, please identify the following:  Court Jurisdiction: [facility] name: Josefine Class   (Obtain Release of information for Sales executive)  '[]'$  Yes '[x]'$  No Pending legal issues? If Yes, explain:   '[]'$  Yes '[x]'$  No Is there a relationship between the presenting condition and legal involvement?  If Yes, explain:   '[]'$  Yes '[x]'$  No Will legal situation influence patient???s progress in care, treatment or services?    If Yes, explain:                                                                                           MEDICAL HISTORY:    PCP: Riley Churches, MD   Date of last visit/physical (note patient estimate if date unknown & offer referral if more than 12 months ago): Pt reports having a physical 5 years ago. Pt was recommended to follow up with PCP.     Allergies: Penicillins    Pain Screening (note if involved with pain management program, date(s) and reason): Denied  Please see Pain Screening form for further information.    Nutrition Screening (note any specific concerns here):  Denied  Please see the Nutrition Screening form for further information.    Hospitalized/ER/Surgery in the last 3 months (if yes, include date and reason): Denied     Prior to Visit Medications    Not on File  No past medical history on file.    No past surgical history on file.    No family history on file.                                    CURRENT AND PAST TREATMENT HISTORY - PSYCHIATRIC and SUBSTANCE USE (both in and outpatient)  Denied treatment Hx    Date(s) Location Type MD/Therapist Compliant? Active?         '[]'$  YES '[]'$  NO '[]'$  YES '[]'$  NO       '[]'$  YES '[]'$  NO '[]'$  YES '[]'$  NO       '[]'$  YES '[]'$  NO '[]'$  YES '[]'$  NO     IDENTIFY EMOTIONAL AND BEHAVIORAL SYMPTOMS AND FUNCTIONING (include both current and past history):     Substance Use Disorder: (If yes, include first and last episode and symptoms): PT reports last alcohol use was 09/2020. Pt reports consuming 1 beer.  substance is often taken in larger amounts or over a longer period of time than intended recurrent substance use in situations in which it is  physically hazardous  Depression (If yes, include first and last episode and symptoms):  None Reported or observed  Mania (if yes, include first and last episode and symptoms):  None reported or observed  Anxiety (if yes, include first and last episode and symptoms):  No symptoms of anxiety reported or observed  Panic (if yes, include first and last episode and symptoms):  None reported or observed  Trauma (if yes, include first and last episode and symptoms):  None reported or observed    Other notable emotional/behavioral symptoms/functioning not listed above: none        '[]'$  Check box if Patient does not present with substance use issues or concerns on the AUDIT or DAST-10, or throughout this assessment.  Briefly explain why In-Depth Substance Use Assessment not necessary at this time (any use of illicit drugs indicates need to complete the In-Depth Substance Use Assessment):    BE    IN-DEPTH SUBSTANCE USE ASSESSMENT  Drug List Age at  First  Use How was/is substance obtained Current Type,  Amount &  Frequency Pattern of Use (i.e., continuous, episodic, binge). Include time period if applicable Last Use Date  and Amount Current Route  or Method of  Ingestion Tolerance  or  Withdrawal   Alcohol   55 y/o purchased  Denied use since 09/2020. Pt reports prior to date she would drink infrequently on the weekends and drinking was tied to social events or gatherings. Pt reports consuming 3-4 beers at a time.  Social and infrequent Early 09/2020 Orally, drank one beer.  Pt reports tolerance remained the same    Marijuana: Specify: Joints/ Bowls/ Blunts  High Potency? Denied          Opiates: Specify:  Oxycontin,Heroin,  Methadone/Suboxone,  Percocet, Vicodin, etc. Denied         Sedatives:  Benzodiazepine,  Xanax, Valium,  Klonopin, etc. Denied         Nicotine: Specify:  Cigarettes, Cigars,  Chew 16-18 y/o purchased 1 pack 2-2 1/2 days        Stimulants:  Amphetamines  (Crystal Meth, Adderall),  Cocaine/ Crack Denied          Other Hallucinogens:  LSD/Mushrooms  Inhalants  Steroids  Synthetic Drugs  Ecstasy Denied  Any other forms of addiction noted?  '[]'$  YES '[x]'$  NO       If YES, indicate type (gambling, sex, etc.):        Additional Comments:     ADDITIONAL INFORMATION REGARDING DRUG OF CHOICE:     Morning use? '[]'$  YES '[x]'$  NO  Do you believe you can control your use once you start? Yes  Longest and last period of voluntary abstinence: Pt reports when she was pregnant for nine months, but denies know other lengths of time   Twelve-step meeting attendance?: '[x]'$  YES '[]'$  NO  Are you willing to attend 12-Step meetings?: '[x]'$  YES '[]'$  NO '[]'$  MAYBE  Do you have a sponsor? '[]'$  YES '[x]'$  NO  BEHAVIORAL HEALTH CARE TRIAGE ASSESSMENT (Page 6 of 12)  HISTORY/NEED FOR DETOX  Number of previous detox admissions? 0       If applicable, location and date of last detox admission:        Is there an immediate need for detox admission at time of this assessment:       '[]'$  Yes      '[x]'$  No withdrawal symptoms     '[]'$  Don???t know, never tried to stop    When you have cut down or stopped using your drug(s) of choice, have you experienced?   '[]'$  shakes/tremors   '[]'$  weakness    '[]'$  runny nose   '[]'$  sweating    '[]'$  heart palpitations   '[]'$  seizures/convulsions   '[]'$  nervousness/irritability  '[]'$  nausea/vomiting   '[]'$  muscle aches   '[]'$  cravings    '[]'$  sleep difficulties   '[]'$  hallucinations   '[]'$  strange dreams   '[]'$  depression   '[]'$  felt suicidal     '[]'$  coughing up blood  '[]'$  bloody diarrhea             '[]'$  other:        DSM-5 Diagnostic Criteria for Substance-Use Disorders Checklist*    For each item, mark whether the client has ever manifested evidence of the symptoms addressed for a given substance. SUD = A problematic pattern of substance use, leading to clinically significant impairment or distress, as manifested by 2 (or more) of the following, occurring within a 78-monthperiod.         If YES is checked for any item below, indicate the substance(s) being referred to  for  that symptom by placing a check in the appropriate column to the right of each question. Alcohol   Cannabis   Hallucinogens   Inhalants   Opioids   Sedatives/  Hypnot  ics   Stimulants O t h e r     1. Is the substance often taken in larger amounts or over a longer period than was intended?                                            '[x]'$  Yes '[]'$  No '[x]'$  '[]'$  '[]'$  '[]'$  '[]'$  '[]'$  '[]'$  '[]'$    2. Is there a persistent desire or unsuccessful efforts to cut down or control use?                                                    '[]'$  Yes '[]'$   No '[]'$  '[]'$  '[]'$  '[]'$  '[]'$  '[]'$  '[]'$  '[]'$    3. Is a great deal of time spent in activities necessary to obtain a substance, or recovering from its effects?                                  '[]'$  Yes '[]'$  No '[]'$  '[]'$  '[]'$  '[]'$  '[]'$  '[]'$  '[]'$  '[]'$    4. Does the client have a craving or strong desire or urge to use substances:                                                                         '[]'$  Yes '[]'$  No '[]'$  '[]'$  '[]'$  '[]'$  '[]'$  '[]'$  '[]'$  '[]'$    5. Is there a recurrent substance use resulting in a failure to fulfill major role obligations at work, school or home?                                '[]'$  Yes '[]'$  No '[]'$  '[]'$  '[]'$  '[]'$  '[]'$  '[]'$  '[]'$  '[]'$    6. Is there continued substance use despite having persistent or recurrent social or interpersonal problems caused or exacerbated by the effects of the substance use?      '[]'$  Yes '[]'$  No '[]'$  '[]'$  '[]'$  '[]'$  '[]'$  '[]'$  '[]'$  '[]'$    7. Are important social, occupational, or recreational activities given up or reduced because of substance use?               '[]'$  Yes '[]'$  No '[]'$  '[]'$  '[]'$  '[]'$  '[]'$  '[]'$  '[]'$  '[]'$    8. Is there recurrent substance use in situations in which it is physically hazardous?                                                               '[x]'$  Yes '[]'$  No '[x]'$  '[]'$  '[]'$  '[]'$  '[]'$  '[]'$  '[]'$  '[]'$    9. Is substance use continued despite knowledge of having a persistent or recurrent physical or psychological problem that is likely to have been cause or exacerbated by substance use?                                                              '[]'$  Yes '[]'$  No '[]'$  '[]'$   '[]'$  '[]'$  '[]'$  '[]'$  '[]'$  '[]'$      The following questions address tolerance, withdrawal & proper prescription use of substance           If YES is checked for any item below, indicate the substance(s) being referred to for that symptom by placing a check in the appropriate column to the right of each question. Alcohol cannabis Ha  ll  ucinogens   I  n  h  a  l  a  n  t  s Op  i  o  i  d  s   Sedatives/  Hypnot  ics   Stimulants O t h e r       10. Indicate all substance showing tolerance:    a) Does client have a need for markedly increased amounts of a substance to achieve intoxication or desired effect?  OR  b) Does client experience a markedly diminished effect with continued use of the same amount of a substance?                                                                                      '[]'$  Yes '[]'$  No '[]'$  '[]'$  '[]'$  '[]'$  '[]'$  '[]'$  '[]'$  '[]'$    For each substance showing tolerance, check box if properly used by prescription: N/A '[]'$  N/A N/A '[]'$  '[]'$  '[]'$  '[]'$    11. a) Has the client experienced withdrawal syndrome, as manifested by the presence of: 2 or more signs or symptoms for alcohol or sedatives; 3 or more for cannabis or opioids; 2 or more plus dysphoric mood for stimulants, when he/she hasn???t had that substance in a while? These signs and symptoms may include: sick (nausea and vomiting), anxious, agitated or irritable, insomnia, fatigue, muscle aches, change in appetite, depressed, diarrhea, fever, sweating or high pulse rate.  OR  b) Has the client continued to take a substance to avoid withdrawal, or taken some other substance in order to feel okay?                                                                                                           '[]'$  Yes '[]'$  No '[]'$  '[]'$  N/A N/A '[]'$  '[]'$  '[]'$  '[]'$    For each substance showing withdrawal, check box if properly used by prescription: N/A '[]'$  N/A N/A '[]'$  '[]'$  '[]'$  '[]'$    For each substance sum and enter the total number of symptoms (0-11) marked Yes.     DO NOT count substances indicated as being  properly used by prescription for items 10 & 11:           Specify current severity by substance:  No Diagnosis = 1 criteria  Mild SUD = 2-3 criteria  Moderate SUD = 4-5 criteria  Severe SUD = 6 or more criteria No Diagnosis '[]'$  '[]'$  '[]'$  '[]'$  '[]'$  '[]'$  '[]'$  '[]'$     MILD SUD '[x]'$  '[]'$  '[]'$  '[]'$  '[]'$  '[]'$  '[]'$  '[]'$     MODERATE SUD '[]'$  '[]'$  '[]'$  '[]'$  '[]'$  '[]'$  '[]'$  '[]'$     SEVERE SUD '[]'$  '[]'$  '[]'$  '[]'$  '[]'$  '[]'$  '[]'$  '[]'$      Specify remission status, if applicable.  How long client has been free of all symptoms (excluding #  4): N/A  Early remission (past 3-11 months)    '[]'$  Early remission (past 3-11 months) '[]'$  Early remission & currently in controlled environment  '[]'$  Sustained remission (12 months or more) '[]'$  Sustained remission & currently in controlled environment  Specify substance(s) in remission: N/A    '[]'$  Alcohol    '[]'$  Cannabis    '[]'$  Hallucinogens    '[]'$  Inhalants    '[]'$  Sedatives/Hypnotics  '[]'$  Stimulants    '[]'$  Opioids     '[]'$  Opioids & currently on opioid maintenance therapy  '[]'$  Other Specify: N/A       ASAM CRITERIA/SEVERITY ANALYSIS   0 ??? Non-issue/low risk  1 ??? Mild difficulty in functioning  2 ??? Moderate difficulty in functioning  3 ??? Serious issue or difficulty coping/in or near ???imminent danger???  4 ??? Severe ??? indicating an ???imminent danger??? concern    SUMMARY/JUSTIFICATION FOR LEVEL OF CARE  0 Dimension 1: Acute Intoxication and/or Withdrawal Potential Comments: PT denied alcohol use since 09/2020  0 Dimension 2: Biomedical Conditions and Complications Comments: Denied or none reported  0 Dimension 3: Emotional, Behavioral, or Cognitive Conditions and Complications Comments: Denied or none reported   1 Dimension 4: Readiness to Change Comments: Pt reports ability to cease alcohol use without cravings or urges. Pt appears to lack understanding of the impact of her use and it leading to recent DUI. PT was minimal with self report and appeared to withhold or underreport her use.     0 Dimension 5: Relapse, Continued Use, or Continued Problem Potential Comments: PT  reports since 09/2020 she has not used alcohol.   0 Dimension 6: Recovery/Living Environment Comments:PT reports supportive family and friend group, PT reports having a sober support friend whom is in recovery and has been helpful.    Comments: Based on Pt's self reports and presenting information she does not meet criteria for care.        NEEDS, STRENGTHS, PREFERENCES, AND GOALS:    Needs (include all treatment and non-treatment needs patient identifies related to presenting problem and current emotional/behavioral functioning): N/A  Strengths: humor, family oriented, compassionate   Preferences (re: treatment): N/A  Goals (describe the patient's identified short- and long-term personal goals):  Patient identifies short-term personal goal(s) of PT reports increasing exercise when the weather warms up  Patient identifies long-term personal goal(s) of Pt reports "just kinda go with the flow".       MENTAL STATUS EXAM (check all that apply and comment if necessary)    GENERAL OBSERVATIONS:  Appearance: '[x]'$  Neatly groomed  '[]'$  Unkempt '[]'$  Disheveled     '[]'$  Other       Demeanor:  '[]'$  Spontaneous   '[]'$  Hostile   '[x]'$  Mistrustful   '[]'$  Preoccupied   '[]'$  Demanding  Activity: '[x]'$  Normal   '[]'$  Hyperactive   '[]'$  Hypoactive   Speech: '[x]'$  Clear   '[]'$  Slurred   '[]'$ Rapid   '[]'$  Pressured   '[]'$  Slow   '[]'$  Soft spoken     '[]'$  Loud   '[]'$  Mute   '[]'$  Rambling   '[]'$  Incoherent   '[]'$  Word salad   '[]'$  Nonsensical  Eye Contact: '[]'$  Average   '[x]'$  Sporadic   '[]'$  Poor   '[]'$  Avoidant   '[]'$  Intense    MOOD & AFFECT:  Mood:   '[]'$  Euthymic   '[]'$  Depressed   '[]'$  Anxious   '[]'$  Angry   '[]'$  Euphoric   '[x]'$  Irritable    '[]'$  Other  Affect:  '[]'$   Full   '[]'$  Blunted   '[x]'$  Constricted   '[]'$  Flat   '[]'$  Inappropriate   '[]'$  Labile    BEHAVIOR:  '[x]'$  Cooperative   '[]'$  Resistant   '[x]'$  Agitated   '[]'$  Impulsive   '[]'$  Hyperactive   '[]'$  Aggresive   '[]'$  Assaultive   '[]'$  Restless   '[]'$  Anhedonia   '[]'$  Akathisia   '[]'$  Dissociating   '[]'$  Withdrawn  '[x]'$  Irritable   '[]'$  Relaxed   '[]'$  Tired/fatigued   '[]'$  Tearful   '[]'$   Pacing   '[]'$  Easily Startled  '[]'$  Trembling/shaking   '[]'$  Grimaces   '[]'$  Other      COGNITION:  Oriented to:    '[x]'$  Person '[x]'$  Place '[x]'$  Time  '[x]'$  Situation  Level of Consciousness:  '[x]'$  Alert   '[]'$  Clouded   '[]'$  Fluctuating  Thought Processes: '[x]'$  Logical   '[]'$  Concrete   '[]'$  Circumstantial   '[]'$  Tangential   '[]'$  Loose   '[]'$  Incoherent   '[]'$  Blocked   '[]'$  Racing   '[]'$  Flight of Ideas    Thought Content:     Delusions:  '[]'$  Grandiose   '[]'$  Persecutory   '[]'$  Somatic   '[]'$  Religious   '[]'$  Bizarre     '[]'$  Nihilistic   '[x]'$  None reported/observed  Other:   '[]'$  Autistic   '[]'$  Obsessions   '[]'$  Guilt   '[]'$  Phobic   '[]'$  Guarded     '[]'$  Preoccupied   '[]'$  Ideas of Reference   '[]'$  Preoccupation with death  Perceptions: '[x]'$  Within normal limits   '[]'$  Illusions   '[]'$  Depersonalization  '[]'$  Derealization     '[]'$  Hallucinations (if present, check all that apply:)  '[]'$  Auditory   '[]'$  Visual   '[]'$  Olfactory   '[]'$ Gustatory   '[]'$  Tactile   '[]'$  Visceral  Memory Tested:        '[]'$  Yes   '[x]'$  No      Memory Disturbance:   '[]'$  Yes   '[x]'$  No Attention Deficit:  '[]'$  Yes   '[x]'$  No   Judgment:  '[]'$  Good   '[x]'$  Fair   '[]'$  Poor Insight:   '[]'$  Good   '[]'$  Fair   '[x]'$  Poor    SUICIDE RISK ASSESSMENT:  Suicidal Ideation: '[x]'$  None Reported   '[]'$  Ideation   '[]'$  Intent   '[]'$  Plan     '[]'$  Self abusive behaviors     (assess lethality if any SI present)    Required: completion of [place]-Suicide Severity Rating Scale (Lifetime/Recent Version) and Risk Assessment    Summary of [place]-Suicide Severity Rating Scales and Risk Assessment (completed in flow sheet):              Assessed Level of Suicide Risk:     '[x]'$  Low Risk    '[]'$ Moderate Risk    '[]'$ High Risk    Rationale for Risk Level (Narrative to include description of ALL FOUR of the following:  historical and current suicidal ideation, suicidal behaviors, non-suicidal self-harming behaviors, and protective factors that support the level of suicide-risk selected.  Rationale to include comment on each endorsed item on C-SSRS as well as clarification re: any  baseline and/or significant changes in thoughts and behaviors related to suicide risk): Pt denied current or past SI, attempts, plan, or intent. PT reports "I like myself" and denies every having thoughts of death. Pt reports multiple protective factors that deter maladaptive thoughts such as, Identifies reasons for living; Supportive social network or family; Responsibility  to family or others/living with family; Engaged in work or school. PT identified a strong support system that she engages with if she feels upset, and utilizations of 1 cognitive and 2 behavioral coping skills. At this time Pt meets criteria for low risk of suicide.     Resources/Interventions provided to patient:    Check any of the following as appropriate to address suicide risk:    '[x]'$  Patient reports no history of suicidal ideation throughout lifetime  '[]'$  Patient has history of suicidal ideation, but denies active suicidal ideation at this time   '[]'$  Patient provided [facility] #   [phone]   '[]'$  Patient resides outside of [place] and provided  [employer] #    '[]'$  Patient provided [place]: text keyword "4hope" to [phone]  '[]'$  Patient provided National Suicide Prevention Line: [phone]     '[]'$  Patient experienced active suicidal ideation with plan/intent within the past 3 months,        completed inpatient psychiatric stay; reports no active suicidal plan/intent at this time  '[]'$  Suicidal Ideation will be monitored each program day      '[]'$  Patient has identified protective factors that will assist in minimizing suicide attempts  '[]'$  Collateral contact made to assist with monitoring safety. Explain:   '[]'$  Patient has/will develop a Suicide Safety Plan with assistance of treatment provider  '[]'$  Patient referred to a different level of outpatient care:    '[]'$  Patient is reporting active suicidal ideation and is being referred for inpatient admission        prior to start of outpatient treatment   '[]'$   Other:        HOMICIDE/AGGRESSIVE BEHAVIOR RISK ASSESSMENT:     Does patient have any current/history of homicidal ideation?   History:  '[]'$  Yes    '[x]'$  No   Explain(identify any history of ideation, intent, plan and/or actual homicidal behaviors):      Current:  '[]'$  Yes    '[x]'$  No   Explain (identify any homicidal ideation, intent, plan and/or actual homicidal behaviors):       Does patient have any recent/history of aggressive behavior/threats towards others that does not include homicidal intent?    History:  '[]'$  Yes    '[x]'$  No   Explain (be specific, including extent of aggression, need for medical intervention, resultant legal charges):      Current:  '[]'$  Yes    '[x]'$  No   Explain  (be specific, including extent of aggression, need for medical intervention, resultant legal charges):     Complete the following if any Current Homicidal Ideation/Aggressive behavior/threats is Present:  '[]'$  YES '[x]'$  NO Is patient experiencing any current thoughts of hurting or killing anyone?   If yes, answer the following questions:   '[]'$  YES '[x]'$  NO Does the patient have any past history of violence or assault toward others or signifi cant property damage? If yes, describe:   '[]'$  YES '[x]'$  NO Is the threat specific to an individual, group, building or location?  If yes, explain:   '[]'$  YES '[x]'$  NO Does the patient possess or have access to a lethal weapon (gun, knife, explosive, etc.)?  If yes, what?:   For clinician only:   '[]'$  YES '[x]'$  NO Is the patient demonstrating signs of emotional disturbance (paranoia, agitation, overt/repressed anger)?   '[]'$  YES '[x]'$  NO Is the patient expressing verbal and/or non-verbal aggression?   '[]'$  YES '[x]'$  NO Any other indications  which promote inhibition of impulses (alcohol/drug use, non-compliance with medications)?   '[]'$  YES '[x]'$  NO Does patient require the safety of an inpatient setting? If yes to one or more, initiate process for inpatient emergency admission for further evaluation and determination of appropriate  placement/outcome   [place]    Comments re: significant MSE findings: Pt presented neatly groomed with weather appropriate clothing. Pt was observably aggitated and irritable throughout assessment, evidence by her body language and tone inflection when answering questions. Pt was cooperative however, struggled with answering specific questions. Pt's eye contact was sporadic, specifically when responding to specific questions about alcohol use. Pt was oriented x3, thought process and content were WNL. Pt exhibits fair judgment with daily decision making however, lacks insight into her alcohol use. Pt was observably defensive throughout assessment, evidenced by body language and her lack of information when providing responses to questions. Pt appears to be an unreliable historian due to provide minimal information provided with responses and or her lack of memory.      RECOMMENDED LEVEL OF CARE:  PT did not meet criteria for care.     LEVEL OF CARE PLACED (Explain if different than Recommended Level of Care): PT did not meet criteria for care.     PATIENT'S RESPONSE TO RECOMMENDATIONS:  PT agreed with recommendations.     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 PT was referred to complete an assessment by [facility]. PT reports living with her husband and 2 children and working full time at a bank. Pt reports that she has had the same job for over 26 years and been married for 27 years. Pt reports "comical" home environment. Pt denies any current struggles and reports good environment.    PT was charged with a DUI and sentenced in 10/2020. Pt reports that this was her first DUI. Pt reports "I had to much to drink at the bar and went to drive home and that wasn't  the smartest thing". Pt reports last drinking alcohol in early November 2021. PT reports that she would drink infrequently, "here an there on the weekends or when the weather is nice". Pt reports when she does drink she consumes 3-4 light beers at a time. PT reports her alcohol use was heaviest during her 63s when she was with friends and going out often. Pt reports recently her alcohol use increased during the beginning of 2020 correlating with the pandemic and being home more. Pt denies other substance use in her lifetime. Therapist asked clarifying questions regarding substance use and Pt exhibited defensiveness, evidenced by body language and responses. Therapist utilized empathy and differently worded questions, which were met with observable resistance and superficial responses.   Pt was observably aggitated and irritable throughout assessment, evidence by her body language and tone inflection when answering questions. Pt was cooperative however, struggled with answering specific questions. Pt's eye contact was sporadic, specifically when responding to specific questions about alcohol use. Pt was oriented x3, thought process and content were WNL. Pt exhibits fair judgment with daily decision making however, lacks insight into her alcohol use. Pt was observably defensive throughout assessment, evidenced by body language and her lack of information when providing responses to questions. Pt appears to be an unreliable  historian due to provide minimal information provided with responses and or her lack of memory. Based on Pt's self reports and presenting information she does not meet criteria for care.         Electronically signed by Tonia Brooms, LPCC on 12/09/2020 at 4:18 PM

## 2020-12-09 NOTE — Other (Unsigned)
Patient Acct Nbr: [id number]   Primary AUTH/CERT:   Primary Insurance Company Name: [employer]  Primary Insurance Plan name: [facility] [employer]Med  Primary Insurance Group Number: [id number]  Primary Insurance Plan Type: Health  Primary Insurance Policy Number: [id number]

## 2020-12-11 NOTE — Telephone Encounter (Signed)
Outpatient Behavioral Health Services    Case Management/Care Coordination Note          Date of Contact:  12/11/20   Start Time: 10:30 End Time: 11:00  Duration: 30    Type of Contact:  Telephone      Patient's Identified Case Management/Care Coordination Need(s) addressed (select all that apply):            Legal               Summary of Contact/Need at This Time: Therapist outreached Pt's referral source, [place] [facility] [employer]. Therapist gathered collateral information regarding Pt's referral and information surrounding recent legal events. Therapist discussed treatment recommendations.         Case Management Plan/Intervention: (select all that apply)         Other: treatment recommendations             Description of Case Management Plan/Intervention: Therapist will compose letter denoting treatment recommendations and fax to referral source.               Note Any Anticipated Barriers:  None        Patient's response to Plan/Intervention (or, if applicable, outcome of Case Management collateral contact/referral): Therapist contacted Pt and left VM.            Progress Towards Case Management Goal(s):  _0  None _1  Minimal _2  Moderate _3  Significant     _4  Case Management Goal(s) Met    Additional Comments: none     Electronically signed by Tonia Brooms, LPCC on 12/11/2020 at 11:00 AM
